# Patient Record
Sex: Female | Born: 1985 | Race: White | Hispanic: No | Marital: Single | Smoking: Current every day smoker
Health system: Southern US, Community
[De-identification: ages and names within clinical notes are randomized; demographics above are authoritative.]

## PROBLEM LIST (undated history)

## (undated) DIAGNOSIS — Z8619 Personal history of other infectious and parasitic diseases: Secondary | ICD-10-CM

## (undated) HISTORY — DX: Personal history of other infectious and parasitic diseases: Z86.19

## (undated) HISTORY — PX: WISDOM TOOTH EXTRACTION: SHX21

---
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
## 2004-06-24 ENCOUNTER — Other Ambulatory Visit: Admission: RE | Admit: 2004-06-24 | Discharge: 2004-06-24 | Payer: Self-pay | Admitting: *Deleted

## 2005-05-05 ENCOUNTER — Other Ambulatory Visit: Admission: RE | Admit: 2005-05-05 | Discharge: 2005-05-05 | Payer: Self-pay | Admitting: Obstetrics and Gynecology

## 2005-12-02 ENCOUNTER — Inpatient Hospital Stay (HOSPITAL_COMMUNITY): Admission: RE | Admit: 2005-12-02 | Discharge: 2005-12-04 | Payer: Self-pay | Admitting: Obstetrics & Gynecology

## 2006-10-25 ENCOUNTER — Other Ambulatory Visit: Admission: RE | Admit: 2006-10-25 | Discharge: 2006-10-25 | Payer: Self-pay | Admitting: *Deleted

## 2008-05-17 ENCOUNTER — Emergency Department (HOSPITAL_COMMUNITY): Admission: EM | Admit: 2008-05-17 | Discharge: 2008-05-17 | Payer: Self-pay | Admitting: Emergency Medicine

## 2008-07-29 ENCOUNTER — Other Ambulatory Visit: Admission: RE | Admit: 2008-07-29 | Discharge: 2008-07-29 | Payer: Self-pay | Admitting: Gynecology

## 2008-07-29 ENCOUNTER — Encounter: Payer: Self-pay | Admitting: Gynecology

## 2008-07-29 ENCOUNTER — Ambulatory Visit: Payer: Self-pay | Admitting: Gynecology

## 2009-08-05 ENCOUNTER — Other Ambulatory Visit: Admission: RE | Admit: 2009-08-05 | Discharge: 2009-08-05 | Payer: Self-pay | Admitting: Gynecology

## 2009-08-05 ENCOUNTER — Ambulatory Visit: Payer: Self-pay | Admitting: Gynecology

## 2010-03-14 IMAGING — CR DG CHEST 2V
2 series · 2 of 2 positions shown · non-contrast
Comparison: None

CLINICAL DATA: Back and right chest pain for 3 days.

CHEST - 2 VIEW

[w chest pa]
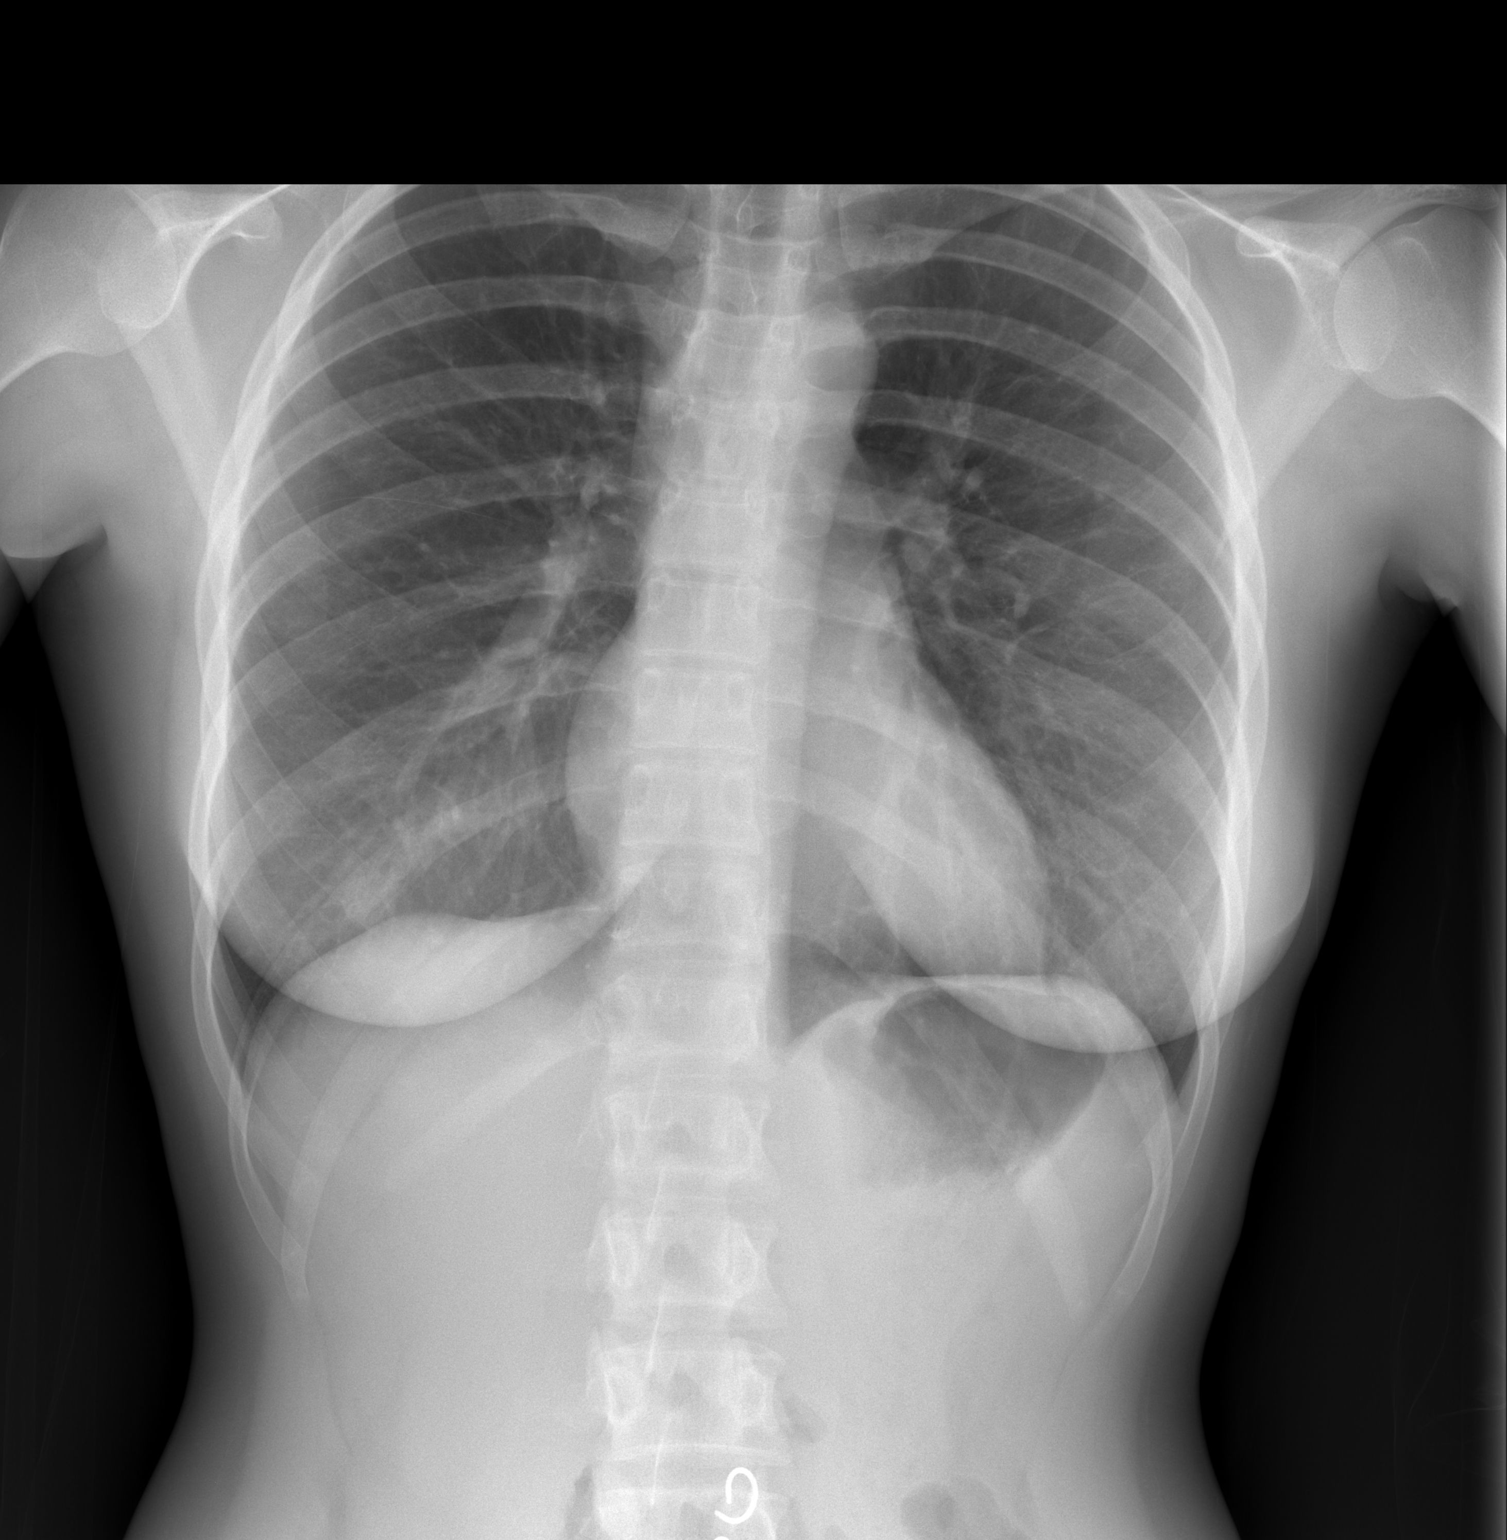

[w chest lat]
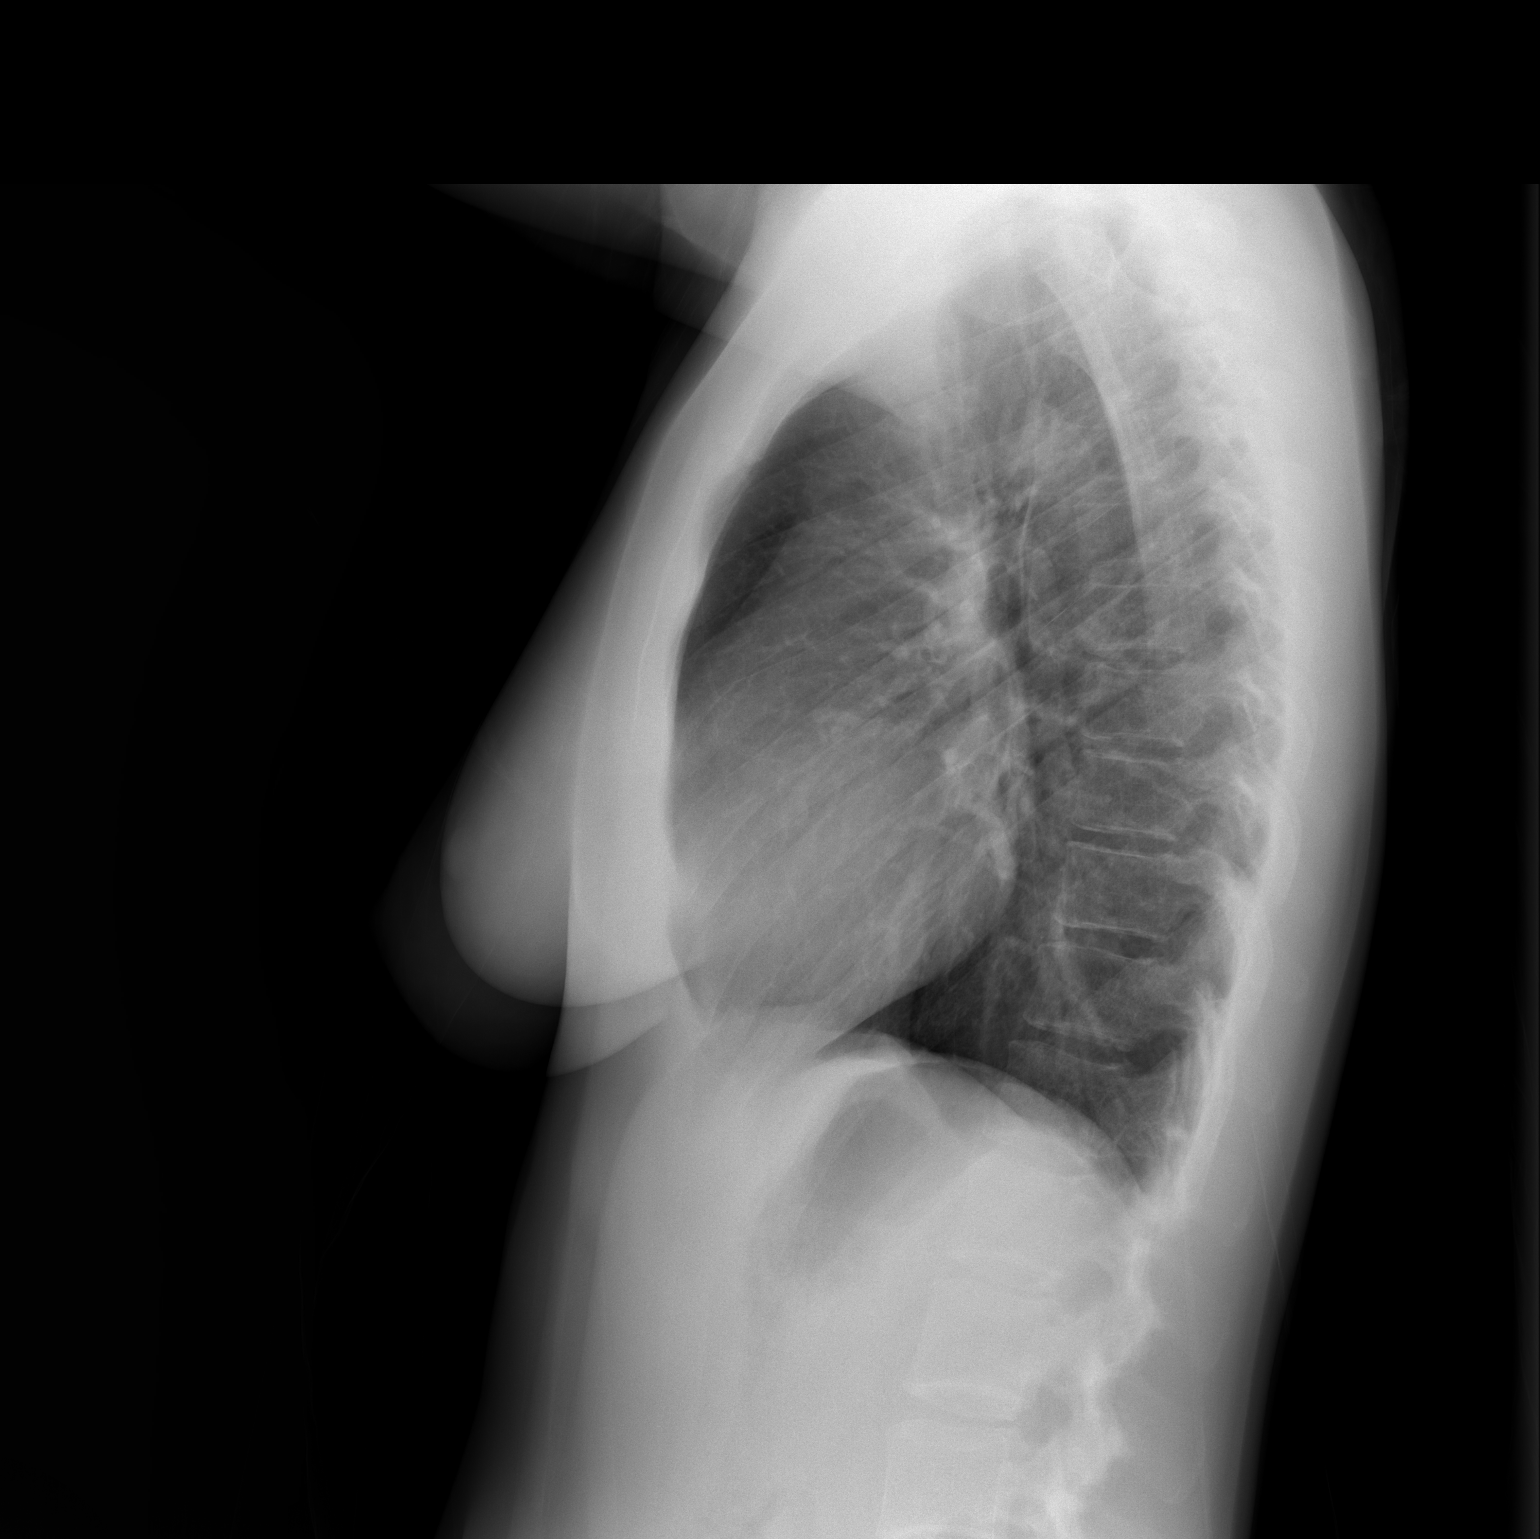

[2 of 2 positions shown; findings below may reference images not displayed]

FINDINGS: The heart size and mediastinal contours are normal.  The
lungs are clear.  There is no pleural effusion or pneumothorax.
Prominent nipple shadows are noted.  No acute osseous findings are
seen.
IMPRESSION: No active cardiopulmonary process.

## 2010-08-10 ENCOUNTER — Encounter (INDEPENDENT_AMBULATORY_CARE_PROVIDER_SITE_OTHER): Payer: Medicaid Other | Admitting: Gynecology

## 2010-08-10 ENCOUNTER — Other Ambulatory Visit: Payer: Self-pay | Admitting: Gynecology

## 2010-08-10 ENCOUNTER — Other Ambulatory Visit (HOSPITAL_COMMUNITY)
Admission: RE | Admit: 2010-08-10 | Discharge: 2010-08-10 | Disposition: A | Discharge status: Home / Self Care | Payer: Medicaid Other | Source: Ambulatory Visit | Attending: Gynecology | Admitting: Gynecology

## 2010-08-10 DIAGNOSIS — Z01419 Encounter for gynecological examination (general) (routine) without abnormal findings: Secondary | ICD-10-CM

## 2010-08-10 DIAGNOSIS — Z113 Encounter for screening for infections with a predominantly sexual mode of transmission: Secondary | ICD-10-CM | POA: Insufficient documentation

## 2010-08-10 DIAGNOSIS — Z124 Encounter for screening for malignant neoplasm of cervix: Secondary | ICD-10-CM | POA: Insufficient documentation

## 2010-08-10 DIAGNOSIS — Z13220 Encounter for screening for lipoid disorders: Secondary | ICD-10-CM

## 2010-09-17 NOTE — Discharge Summary (Signed)
NAME:  Jamie Glenn, Jamie Glenn NO.:  [id number]   MEDICAL RECORD NO.:  [id number]          PATIENT TYPE:  INP   LOCATION:  9105                          FACILITY:  [facility]   PHYSICIAN:  Gerrit Friends. Aldona Bar, M.D.   DATE OF BIRTH:  07/13/1985   DATE OF ADMISSION:  12/02/2005  DATE OF DISCHARGE:  12/04/2005                                 DISCHARGE SUMMARY   DISCHARGE DIAGNOSES:  1.  Term pregnancy, delivered a 6 pound 10 ounce female infant, Apgars 7 and      9.  2.  Blood type O positive.  3.  Induction for oligohydramnios.   PROCEDURES:  1.  Normal spontaneous delivery.  2.  Primary tear and repair.   SUMMARY:  This 25 year old primigravida was seen in the office on August 2  and noted to have a very low amniotic fluid volume.  Her pregnancy was  complicated by her history of bipolar disorder as well as marijuana and  alcohol use during the first trimester of her pregnancy.  She took a  medication called Seroquel about 15 mg a day for her bipolar disorder during  the majority of her pregnancy.  She did undergo amniocentesis at [redacted] weeks  gestation for I presume an abnormal quad screen   Once the patient was admitted, she responded well to Cytotec with cervical  change and subsequently underwent amniotomy with production of a small  amount of clear fluid, and subsequently labored and had a normal spontaneous  delivery of a 6 pound 10 ounce female infant with Apgars of 7 and 9 over a  first-degree tear which was repaired without difficulty.  There was a tight  nuchal cord x1.  Mother's postpartum course was relatively uncomplicated.  Discharge hemoglobin 13 with a white count 16,700 and platelet count of  131,000.   On the morning of August 5, she was ambulating well, tolerating a regular  diet well, having normal bowel and bladder function, was afebrile, and  breast feeding was going well. She was desirous of discharge and accordingly  was discharged to home.   DISCHARGE  MEDICATIONS:  Vitamins, 1 a day.  She was given 2 prescriptions:  Motrin 16 mg to use every 6 hours as needed for cramping or pain and Tylox 1-  2 every 4-6 hours as needed for more severe pain.   She will return to the office for followup in approximately four weeks' time  or as needed.  She was given an instruction brochure and understood all  instructions well at the time of discharge.   The patient was instructed to discuss with pediatricians the use of Seroquel  while breast feeding.   CONDITION ON DISCHARGE:  Improved.      Gerrit Friends. Aldona Bar, M.D.  Electronically Signed     RMW/MEDQ  D:  12/04/2005  T:  12/04/2005  Job:  [id number]

## 2011-09-01 ENCOUNTER — Encounter: Payer: Medicaid Other | Admitting: Gynecology

## 2011-09-02 ENCOUNTER — Other Ambulatory Visit (HOSPITAL_COMMUNITY)
Admission: RE | Admit: 2011-09-02 | Discharge: 2011-09-02 | Disposition: A | Discharge status: Home / Self Care | Payer: Medicaid Other | Source: Ambulatory Visit | Attending: Gynecology | Admitting: Gynecology

## 2011-09-02 ENCOUNTER — Encounter: Payer: Self-pay | Admitting: Gynecology

## 2011-09-02 ENCOUNTER — Ambulatory Visit (INDEPENDENT_AMBULATORY_CARE_PROVIDER_SITE_OTHER): Payer: Medicaid Other | Admitting: Gynecology

## 2011-09-02 VITALS — BP 120/82 | Ht 62.5 in | Wt 131.0 lb

## 2011-09-02 DIAGNOSIS — Z01419 Encounter for gynecological examination (general) (routine) without abnormal findings: Secondary | ICD-10-CM | POA: Insufficient documentation

## 2011-09-02 DIAGNOSIS — N644 Mastodynia: Secondary | ICD-10-CM

## 2011-09-02 DIAGNOSIS — Z113 Encounter for screening for infections with a predominantly sexual mode of transmission: Secondary | ICD-10-CM | POA: Insufficient documentation

## 2011-09-02 LAB — CBC WITH DIFFERENTIAL/PLATELET
Eosinophils Absolute: 0.2 10*3/uL (ref 0.0–0.7)
Hemoglobin: 13.3 g/dL (ref 12.0–15.0)
Lymphocytes Relative: 23 % (ref 12–46)
Lymphs Abs: 2.6 10*3/uL (ref 0.7–4.0)
Neutro Abs: 7.3 10*3/uL (ref 1.7–7.7)
Neutrophils Relative %: 64 % (ref 43–77)
Platelets: 215 10*3/uL (ref 150–400)
RBC: 4.36 MIL/uL (ref 3.87–5.11)
WBC: 11.3 10*3/uL — ABNORMAL HIGH (ref 4.0–10.5)

## 2011-09-02 LAB — PREGNANCY, URINE: Preg Test, Ur: NEGATIVE

## 2011-09-02 MED ORDER — NORGESTREL-ETHINYL ESTRADIOL 0.3-30 MG-MCG PO TABS: 1.0000 | ORAL_TABLET | Freq: Every day | ORAL | Status: DC

## 2011-09-02 NOTE — Progress Notes (Addendum)
Jamie Glenn 1985-06-10 [id number]   History:    26 y.o.  for annual exam complaining of breast tenderness since the completion of her menses. Patient smokes half a pack a cigarettes daily and drinks more than 6 polyp averages a day. She is on Lo/Ovral oral contraceptive pills and is having normal menstrual cycles. Review of patient's records indicated the following:  In 2003 patient had undergone a colposcopic evaluation as a result of low-grade squamous intraepithelial lesion (CIN I) negative colposcopic findings but an ECC was obtained which had demonstrated nonspecific chronic endocervicitis associated with endocervical glandular atypia and focal squamous metaplasia. Description by the pathologist indicated that this was associated with reactive inflammatory change rather than a dysplastic change. Patient subsequently has been followed with Pap smears on a regular basis and has been normal since then. Patient has a history of trichomoniasis in 2008.  Past medical history,surgical history, family history and social history were all reviewed and documented in the EPIC chart.  Gynecologic History Patient's last menstrual period was 08/27/2011. Contraception: OCP (estrogen/progesterone) Last Pap: 2012. Results were: normal Last mammogram: Not indicated. Results were: Not indicated  Obstetric History OB History    Grav Para Term Preterm Abortions TAB SAB Ect Mult Living   1 1 1       1      # Outc Date GA Lbr Len/2nd Wgt Sex Del Anes PTL Lv   1 TRM     M SVD  No Yes       ROS:  Was performed and pertinent positives and negatives are included in the history.  Exam: chaperone present  BP 120/82  Ht 5' 2.5" (1.588 m)  Wt 131 lb (59.421 kg)  BMI 23.58 kg/m2  LMP 08/27/2011  Body mass index is 23.58 kg/(m^2).  General appearance : Well developed well nourished female. No acute distress HEENT: Neck supple, trachea midline, no carotid bruits, no thyroidmegaly Lungs: Clear to  auscultation, no rhonchi or wheezes, or rib retractions  Heart: Regular rate and rhythm, no murmurs or gallops Breast:Examined in sitting and supine position were symmetrical in appearance, no palpable masses or tenderness,  no skin retraction, no nipple inversion, no nipple discharge, no skin discoloration, no axillary or supraclavicular lymphadenopathy Abdomen: no palpable masses or tenderness, no rebound or guarding Extremities: no edema or skin discoloration or tenderness  Pelvic:  Bartholin, Urethra, Skene Glands: Within normal limits             Vagina: No gross lesions or discharge  Cervix: No gross lesions or discharge  Uterus  anteverted, normal size, shape and consistency, non-tender and mobile  Adnexa  Without masses or tenderness  Anus and perineum  normal   Rectovaginal  normal sphincter tone without palpated masses or tenderness             Hemoccult not done     Assessment/Plan:  26 y.o. female for annual exam with mastodynia. We discussed importance of self breast examination. We discussed in detail the detrimental effects of smoking as well as being on the oral contraceptive pill which could lead to DVT and pulmonary embolism. She was offered treatment for smoking and says that she would rather go to the anti- smoking support group with her partner instead. The following labs were drawn today: CBC, urinalysis, GC and Chlamydia culture, and Pap smear. We'll see her back in one year or when necessary. Urine pregnancy today was negative.   Ok Edwards MD, 11:25 AM 09/02/2011

## 2011-09-02 NOTE — Patient Instructions (Addendum)
Breast Tenderness Breast tenderness is a common complaint made by women of all ages. It is also called mastalgia or mastodynia, which means breast pain. The condition can range from mild discomfort to severe pain. It has a variety of causes. Your caregiver will find out the likely cause of your breast tenderness by examining your breasts, asking you about symptoms and perhaps ordering some tests. Breast tenderness usually does not mean you have breast cancer. CAUSES  Breast tenderness has many possible causes. They include:  Premenstrual changes. A week to 10 days before your period, your breasts might ache or feel tender.   Other hormonal causes. These include:   When sexual and physical traits mature (puberty).   Pregnancy.   The time right before and the year after menopause (perimenopause).   The day when it has been 12 months since your last period (menopause).   Large breasts.   Infection (also called mastitis).   Birth control pills.   Breastfeeding. Tenderness can occur if the breasts are overfull with milk or if a milk duct is blocked.   Injury.   Fibrocystic breast changes. This is not cancer (benign). It causes painful breasts that feel lumpy.   Fluid-filled sacs (cysts). Often cysts can be drained in your healthcare provider's office.   Fibroadenoma. This is a tumor that is not cancerous.   Medication side effects. Blood pressure drugs and diuretics (which increase urine flow) sometimes cause breast tenderness.   Previous breast surgery, such as a breast reduction.   Breast cancer. Cancer is rarely the reason breasts are tender. In most women, tenderness is caused by something else.  DIAGNOSIS  Several methods can be used to find out why your breasts are tender. They include:  Visual inspection of the breasts.   Examination by hand.   Tests, such as:   Mammogram.   Ultrasound.   Biopsy.   Lab test of any fluid coming from the nipple.   Blood tests.     MRI.  TREATMENT  Treatment is directed to the cause of the breast tenderness from doing nothing for minor discomfort, wearing a good support bra but also may include:  Taking over-the-counter medicines for pain or discomfort as directed by your caregiver.   Prescription medicine for breast tenderness related to:   Premenstrual.   Fibrocystic.   Puberty.   Pregnancy.   Menopause.   Previous breast surgery.   Large breasts.   Antibiotics for infection.   Birth control pills for fibrocystic and premenstrual changes.   More frequent feedings or pumping of the breasts and warm compresses for breast engorgement when nursing.   Cold and warm compresses and a good support bra for most breast injuries.   Breast cysts are sometimes drained with a needle (aspiration) or removed with minor surgery.   Fibroadenomas are usually removed with minor surgery.   Changing or stopping the medicine when it is responsible for causing the breast tenderness.   When breast cancer is present with or without causing pain, it is usually treated with major surgery (with or without radiation) and chemotherapy.  HOME CARE INSTRUCTIONS  Breast tenderness often can be handled at home. You can try:  Getting fitted for a new bra that provides more support, especially during exercise.   Wearing a more supportive or sports bra while sleeping when your breasts are very tender.   If you have a breast injury, using an ice pack for 15 to 20 minutes. Wrap the pack in a   towel. Do not put the ice pack directly on your breast.   If your breasts are too full of milk as a result of breastfeeding, try:   Expressing milk either by hand or with a breast pump.   Applying a warm compress for relief.   Taking over-the-counter pain relievers, if this is OK with your caregiver.   Taking medicine that your caregiver prescribes. These might include antibiotics or birth control pills.  Over the long term, your  breast tenderness might be eased if you:  Cut down on caffeine.   Reduce the amount of fat in your diet.  Also, learn how to do breast examinations at home. This will help you tell when you have an unusual growth or lump that could cause tenderness. And keep a log of the days and times when your breasts are most tender. This will help you and your caregiver find the right solution. SEEK MEDICAL CARE IF:   Any part of your breast is hard, red and hot to the touch. This could be a sign of infection.   Fluid is coming out of your nipples (and you are not breastfeeding). Especially watch for blood or pus.   You have a fever as well as breast tenderness.   You have a new or painful lump in your breast that remains after your period ends.   You have tried to take care of the pain at home, but it has not gone away.   Your breast pain is getting worse. Or, the pain is making it hard to do the things you usually do during your day.  Document Released: 03/31/2008 Document Revised: 04/07/2011 Document Reviewed: 03/31/2008 [facility] Patient Information 2012 [place], [place].  Smoking Cessation This document explains the best ways for you to quit smoking and new treatments to help. It lists new medicines that can double or triple your chances of quitting and quitting for good. It also considers ways to avoid relapses and concerns you may have about quitting, including weight gain. NICOTINE: A POWERFUL ADDICTION If you have tried to quit smoking, you know how hard it can be. It is hard because nicotine is a very addictive drug. For some people, it can be as addictive as heroin or cocaine. Usually, people make 2 or 3 tries, or more, before finally being able to quit. Each time you try to quit, you can learn about what helps and what hurts. Quitting takes hard work and a lot of effort, but you can quit smoking. QUITTING SMOKING IS ONE OF THE MOST IMPORTANT THINGS YOU WILL EVER DO.  You will live longer,  feel better, and live better.   The impact on your body of quitting smoking is felt almost immediately:   Within 20 minutes, blood pressure decreases. Pulse returns to its normal level.   After 8 hours, carbon monoxide levels in the blood return to normal. Oxygen level increases.   After 24 hours, chance of heart attack starts to decrease. Breath, hair, and body stop smelling like smoke.   After 48 hours, damaged nerve endings begin to recover. Sense of taste and smell improve.   After 72 hours, the body is virtually free of nicotine. Bronchial tubes relax and breathing becomes easier.   After 2 to 12 weeks, lungs can hold more air. Exercise becomes easier and circulation improves.   Quitting will reduce your risk of having a heart attack, stroke, cancer, or lung disease:   After 1 year, the risk of coronary heart  disease is cut in half.   After 5 years, the risk of stroke falls to the same as a nonsmoker.   After 10 years, the risk of lung cancer is cut in half and the risk of other cancers decreases significantly.   After 15 years, the risk of coronary heart disease drops, usually to the level of a nonsmoker.   If you are pregnant, quitting smoking will improve your chances of having a healthy baby.   The people you live with, especially your children, will be healthier.   You will have extra money to spend on things other than cigarettes.  FIVE KEYS TO QUITTING Studies have shown that these 5 steps will help you quit smoking and quit for good. You have the best chances of quitting if you use them together: 1. Get ready.  2. Get support and encouragement.  3. Learn new skills and behaviors.  4. Get medicine to reduce your nicotine addiction and use it correctly.  5. Be prepared for relapse or difficult situations. Be determined to continue trying to quit, even if you do not succeed at first.  1. GET READY  Set a quit date.   Change your environment.   Get rid of ALL  cigarettes, ashtrays, matches, and lighters in your home, car, and place of work.   Do not let people smoke in your home.   Review your past attempts to quit. Think about what worked and what did not.   Once you quit, do not smoke. NOT EVEN A PUFF!  2. GET SUPPORT AND ENCOURAGEMENT Studies have shown that you have a better chance of being successful if you have help. You can get support in many ways.  Tell your family, friends, and coworkers that you are going to quit and need their support. Ask them not to smoke around you.   Talk to your caregivers (doctor, dentist, nurse, pharmacist, psychologist, and/or smoking counselor).   Get individual, group, or telephone counseling and support. The more counseling you have, the better your chances are of quitting. Programs are available at [employer] and health centers. Call your local health department for information about programs in your area.   Spiritual beliefs and practices may help some smokers quit.   Quit meters are Photographer that keep track of quit statistics, such as amount of "quit-time," cigarettes not smoked, and money saved.   Many smokers find one or more of the many self-help books available useful in helping them quit and stay off tobacco.  3. LEARN NEW SKILLS AND BEHAVIORS  Try to distract yourself from urges to smoke. Talk to someone, go for a walk, or occupy your time with a task.   When you first try to quit, change your routine. Take a different route to work. Drink tea instead of coffee. Eat breakfast in a different place.   Do something to reduce your stress. Take a hot bath, exercise, or read a book.   Plan something enjoyable to do every day. Reward yourself for not smoking.   Explore interactive web-based programs that specialize in helping you quit.  4. GET MEDICINE AND USE IT CORRECTLY Medicines can help you stop smoking and decrease the urge to smoke. Combining  medicine with the above behavioral methods and support can quadruple your chances of successfully quitting smoking. The U.S. Food and Drug Administration (FDA) has approved 7 medicines to help you quit smoking. These medicines fall into 3 categories.  Nicotine replacement  therapy (delivers nicotine to your body without the negative effects and risks of smoking):   Nicotine gum: Available over-the-counter.   Nicotine lozenges: Available over-the-counter.   Nicotine inhaler: Available by prescription.   Nicotine nasal spray: Available by prescription.   Nicotine skin patches (transdermal): Available by prescription and over-the-counter.   Antidepressant medicine (helps people abstain from smoking, but how this works is unknown):   Bupropion sustained-release (SR) tablets: Available by prescription.   Nicotinic receptor partial agonist (simulates the effect of nicotine in your brain):   Varenicline tartrate tablets: Available by prescription.   Ask your caregiver for advice about which medicines to use and how to use them. Carefully read the information on the package.   Everyone who is trying to quit may benefit from using a medicine. If you are pregnant or trying to become pregnant, nursing an infant, you are under age 61, or you smoke fewer than 10 cigarettes per day, talk to your caregiver before taking any nicotine replacement medicines.   You should stop using a nicotine replacement product and call your caregiver if you experience nausea, dizziness, weakness, vomiting, fast or irregular heartbeat, mouth problems with the lozenge or gum, or redness or swelling of the skin around the patch that does not go away.   Do not use any other product containing nicotine while using a nicotine replacement product.   Talk to your caregiver before using these products if you have diabetes, heart disease, asthma, stomach ulcers, you had a recent heart attack, you have high blood pressure that is  not controlled with medicine, a history of irregular heartbeat, or you have been prescribed medicine to help you quit smoking.  5. BE PREPARED FOR RELAPSE OR DIFFICULT SITUATIONS  Most relapses occur within the first 3 months after quitting. Do not be discouraged if you start smoking again. Remember, most people try several times before they finally quit.   You may have symptoms of withdrawal because your body is used to nicotine. You may crave cigarettes, be irritable, feel very hungry, cough often, get headaches, or have difficulty concentrating.   The withdrawal symptoms are only temporary. They are strongest when you first quit, but they will go away within 10 to 14 days.  Here are some difficult situations to watch for:  Alcohol. Avoid drinking alcohol. Drinking lowers your chances of successfully quitting.   Caffeine. Try to reduce the amount of caffeine you consume. It also lowers your chances of successfully quitting.   Other smokers. Being around smoking can make you want to smoke. Avoid smokers.   Weight gain. Many smokers will gain weight when they quit, usually less than 10 pounds. Eat a healthy diet and stay active. Do not let weight gain distract you from your main goal, quitting smoking. Some medicines that help you quit smoking may also help delay weight gain. You can always lose the weight gained after you quit.   Bad mood or depression. There are a lot of ways to improve your mood other than smoking.  If you are having problems with any of these situations, talk to your caregiver. SPECIAL SITUATIONS AND CONDITIONS Studies suggest that everyone can quit smoking. Your situation or condition can give you a special reason to quit.  Pregnant women/new mothers: By quitting, you protect your baby's health and your own.   Hospitalized patients: By quitting, you reduce health problems and help healing.   Heart attack patients: By quitting, you reduce your risk of a second heart  attack.   Lung, head, and neck cancer patients: By quitting, you reduce your chance of a second cancer.   Parents of children and adolescents: By quitting, you protect your children from illnesses caused by secondhand smoke.  QUESTIONS TO THINK ABOUT Think about the following questions before you try to stop smoking. You may want to talk about your answers with your caregiver.  Why do you want to quit?   If you tried to quit in the past, what helped and what did not?   What will be the most difficult situations for you after you quit? How will you plan to handle them?   Who can help you through the tough times? Your family? Friends? Caregiver?   What pleasures do you get from smoking? What ways can you still get pleasure if you quit?  Here are some questions to ask your caregiver:  How can you help me to be successful at quitting?   What medicine do you think would be best for me and how should I take it?   What should I do if I need more help?   What is smoking withdrawal like? How can I get information on withdrawal?  Quitting takes hard work and a lot of effort, but you can quit smoking. FOR MORE INFORMATION  Smokefree.gov (http://www.davis-sullivan.com/) provides free, accurate, evidence-based information and professional assistance to help support the immediate and long-term needs of people trying to quit smoking. Document Released: 04/12/2001 Document Revised: 04/07/2011 Document Reviewed: 02/02/2009 [facility] Patient Information 2012 [place], [place].   Vitamin E 600 units daily helps with chronic breast tenderness  Breast Self-Exam A self breast exam may help you find changes or problems while they are still small. Do a breast self-exam:  Every month.   One week after your period (menstrual period).   On the first day of each month if you do not have periods anymore.  Look for any:  Change in breast color, size, or shape.   Dimples in your breast.   Changes in your  nipples or skin.   Dry skin on your breasts or nipples.   Watery or bloody discharge from your nipples.   Feel for:  Lumps.   Thick, hard places.   Any other changes.  HOME CARE There are 3 ways to do the breast self-exam: In front of a mirror.  Lift your arms over your head and turn side to side.   Put your hands on your hips and lean down, then turn from side to side.   Bend forward and turn from side to side.  In the shower.  With soapy hands, check both breasts. Then check above and below your collarbone and your armpits.   Feel above and below your collarbone down to under your breast, and from the center of your chest to the outer edge of the armpit. Check for any lumps or hard spots.   Using the tips of your middle three fingers check your whole breast by pressing your hand over your breast in a circle or in an up and down motion.  Lying down.  Lie flat on your bed.   Put a small pillow under the breast you are going to check. On that same side, put your hand behind your head.   With your other hand, use the 3 middle fingers to feel the breast.   Move your fingers in a circle around the breast. Press firmly over all parts of the breast to feel for any  lumps.  GET HELP RIGHT AWAY IF: You find any changes in your breasts so they can be checked. Document Released: 10/05/2007 Document Revised: 04/07/2011 Document Reviewed: 08/06/2008 [facility] Patient Information 2012 [place], [place].

## 2011-09-03 LAB — URINALYSIS W MICROSCOPIC + REFLEX CULTURE
Casts: NONE SEEN
Hgb urine dipstick: NEGATIVE
Leukocytes, UA: NEGATIVE
Nitrite: NEGATIVE
Specific Gravity, Urine: 1.018 (ref 1.005–1.030)
Squamous Epithelial / LPF: NONE SEEN
pH: 6 (ref 5.0–8.0)

## 2011-09-05 ENCOUNTER — Other Ambulatory Visit: Payer: Self-pay | Admitting: *Deleted

## 2011-09-05 DIAGNOSIS — D72829 Elevated white blood cell count, unspecified: Secondary | ICD-10-CM

## 2011-09-08 ENCOUNTER — Other Ambulatory Visit: Payer: Medicaid Other

## 2011-09-08 DIAGNOSIS — D72829 Elevated white blood cell count, unspecified: Secondary | ICD-10-CM

## 2011-09-08 LAB — CBC WITH DIFFERENTIAL/PLATELET
Eosinophils Relative: 1 % (ref 0–5)
HCT: 42.5 % (ref 36.0–46.0)
Lymphocytes Relative: 32 % (ref 12–46)
Lymphs Abs: 2.8 10*3/uL (ref 0.7–4.0)
MCV: 95.7 fL (ref 78.0–100.0)
Monocytes Absolute: 1 10*3/uL (ref 0.1–1.0)
RBC: 4.44 MIL/uL (ref 3.87–5.11)
RDW: 13.6 % (ref 11.5–15.5)
WBC: 8.8 10*3/uL (ref 4.0–10.5)

## 2011-09-20 ENCOUNTER — Telehealth: Payer: Self-pay | Admitting: *Deleted

## 2011-09-20 MED ORDER — NORETHINDRONE 0.35 MG PO TABS: 1.0000 | ORAL_TABLET | Freq: Every day | ORAL | Status: DC

## 2011-09-20 NOTE — Telephone Encounter (Signed)
Left the below on pt voicemail, rx sent 

## 2011-09-20 NOTE — Telephone Encounter (Signed)
Pt called stating she was taking Micronor (progesterone only)  back in April 2012 and now you switched her to lo/ ovral 28 day. Pt said she still smokes the same amount of cigarettes per day (half pack) . She would like to know why she is taking the lo/ovral 28 rather than starting back on Micronor? Please advise

## 2011-09-20 NOTE — Telephone Encounter (Signed)
It appears that she had been on low overall from what I saw in her chart but because of her smoking the Microgestin would would be a better pill for her. Please call her in the Microgestin for her to continue to take instead but finish of her current pack. We can call her in one month supply with 11 refills

## 2012-09-04 ENCOUNTER — Encounter: Payer: Self-pay | Admitting: Gynecology

## 2012-10-25 ENCOUNTER — Other Ambulatory Visit (HOSPITAL_COMMUNITY)
Admission: RE | Admit: 2012-10-25 | Discharge: 2012-10-25 | Disposition: A | Discharge status: Home / Self Care | Payer: Medicaid Other | Source: Ambulatory Visit | Attending: Gynecology | Admitting: Gynecology

## 2012-10-25 ENCOUNTER — Encounter: Payer: Self-pay | Admitting: Gynecology

## 2012-10-25 ENCOUNTER — Ambulatory Visit (INDEPENDENT_AMBULATORY_CARE_PROVIDER_SITE_OTHER): Payer: Medicaid Other | Admitting: Gynecology

## 2012-10-25 VITALS — BP 124/78 | Ht 62.25 in | Wt 133.0 lb

## 2012-10-25 DIAGNOSIS — Z01419 Encounter for gynecological examination (general) (routine) without abnormal findings: Secondary | ICD-10-CM

## 2012-10-25 DIAGNOSIS — Z124 Encounter for screening for malignant neoplasm of cervix: Secondary | ICD-10-CM

## 2012-10-25 DIAGNOSIS — F17200 Nicotine dependence, unspecified, uncomplicated: Secondary | ICD-10-CM | POA: Insufficient documentation

## 2012-10-25 DIAGNOSIS — Z113 Encounter for screening for infections with a predominantly sexual mode of transmission: Secondary | ICD-10-CM

## 2012-10-25 DIAGNOSIS — Z3009 Encounter for other general counseling and advice on contraception: Secondary | ICD-10-CM

## 2012-10-25 LAB — WET PREP FOR TRICH, YEAST, CLUE
Clue Cells Wet Prep HPF POC: NONE SEEN
WBC, Wet Prep HPF POC: NONE SEEN
Yeast Wet Prep HPF POC: NONE SEEN

## 2012-10-25 LAB — CBC WITH DIFFERENTIAL/PLATELET
Basophils Relative: 1 % (ref 0–1)
Eosinophils Absolute: 0.1 10*3/uL (ref 0.0–0.7)
HCT: 40.8 % (ref 36.0–46.0)
Hemoglobin: 14 g/dL (ref 12.0–15.0)
MCH: 30.3 pg (ref 26.0–34.0)
MCHC: 34.3 g/dL (ref 30.0–36.0)
Monocytes Absolute: 1.2 10*3/uL — ABNORMAL HIGH (ref 0.1–1.0)
Monocytes Relative: 15 % — ABNORMAL HIGH (ref 3–12)

## 2012-10-25 MED ORDER — NORETHINDRONE 0.35 MG PO TABS: 1.0000 | ORAL_TABLET | Freq: Every day | ORAL | Status: DC

## 2012-10-25 NOTE — Patient Instructions (Addendum)
Smoking Cessation Quitting smoking is important to your health and has many advantages. However, it is not always easy to quit since nicotine is a very addictive drug. Often times, people try 3 times or more before being able to quit. This document explains the best ways for you to prepare to quit smoking. Quitting takes hard work and a lot of effort, but you can do it. ADVANTAGES OF QUITTING SMOKING  You will live longer, feel better, and live better.  Your body will feel the impact of quitting smoking almost immediately.  Within 20 minutes, blood pressure decreases. Your pulse returns to its normal level.  After 8 hours, carbon monoxide levels in the blood return to normal. Your oxygen level increases.  After 24 hours, the chance of having a heart attack starts to decrease. Your breath, hair, and body stop smelling like smoke.  After 48 hours, damaged nerve endings begin to recover. Your sense of taste and smell improve.  After 72 hours, the body is virtually free of nicotine. Your bronchial tubes relax and breathing becomes easier.  After 2 to 12 weeks, lungs can hold more air. Exercise becomes easier and circulation improves.  The risk of having a heart attack, stroke, cancer, or lung disease is greatly reduced.  After 1 year, the risk of coronary heart disease is cut in half.  After 5 years, the risk of stroke falls to the same as a nonsmoker.  After 10 years, the risk of lung cancer is cut in half and the risk of other cancers decreases significantly.  After 15 years, the risk of coronary heart disease drops, usually to the level of a nonsmoker.  If you are pregnant, quitting smoking will improve your chances of having a healthy baby.  The people you live with, especially any children, will be healthier.  You will have extra money to spend on things other than cigarettes. QUESTIONS TO THINK ABOUT BEFORE ATTEMPTING TO QUIT You may want to talk about your answers with your  caregiver.  Why do you want to quit?  If you tried to quit in the past, what helped and what did not?  What will be the most difficult situations for you after you quit? How will you plan to handle them?  Who can help you through the tough times? Your family? Friends? A caregiver?  What pleasures do you get from smoking? What ways can you still get pleasure if you quit? Here are some questions to ask your caregiver:  How can you help me to be successful at quitting?  What medicine do you think would be best for me and how should I take it?  What should I do if I need more help?  What is smoking withdrawal like? How can I get information on withdrawal? GET READY  Set a quit date.  Change your environment by getting rid of all cigarettes, ashtrays, matches, and lighters in your home, car, or work. Do not let people smoke in your home.  Review your past attempts to quit. Think about what worked and what did not. GET SUPPORT AND ENCOURAGEMENT You have a better chance of being successful if you have help. You can get support in many ways.  Tell your family, friends, and co-workers that you are going to quit and need their support. Ask them not to smoke around you.  Get individual, group, or telephone counseling and support. Programs are available at local hospitals and health centers. Call your local health department for   information about programs in your area.  Spiritual beliefs and practices may help some smokers quit.  Download a "quit meter" on your computer to keep track of quit statistics, such as how long you have gone without smoking, cigarettes not smoked, and money saved.  Get a self-help book about quitting smoking and staying off of tobacco. LEARN NEW SKILLS AND BEHAVIORS  Distract yourself from urges to smoke. Talk to someone, go for a walk, or occupy your time with a task.  Change your normal routine. Take a different route to work. Drink tea instead of coffee.  Eat breakfast in a different place.  Reduce your stress. Take a hot bath, exercise, or read a book.  Plan something enjoyable to do every day. Reward yourself for not smoking.  Explore interactive web-based programs that specialize in helping you quit. GET MEDICINE AND USE IT CORRECTLY Medicines can help you stop smoking and decrease the urge to smoke. Combining medicine with the above behavioral methods and support can greatly increase your chances of successfully quitting smoking.  Nicotine replacement therapy helps deliver nicotine to your body without the negative effects and risks of smoking. Nicotine replacement therapy includes nicotine gum, lozenges, inhalers, nasal sprays, and skin patches. Some may be available over-the-counter and others require a prescription.  Antidepressant medicine helps people abstain from smoking, but how this works is unknown. This medicine is available by prescription.  Nicotinic receptor partial agonist medicine simulates the effect of nicotine in your brain. This medicine is available by prescription. Ask your caregiver for advice about which medicines to use and how to use them based on your health history. Your caregiver will tell you what side effects to look out for if you choose to be on a medicine or therapy. Carefully read the information on the package. Do not use any other product containing nicotine while using a nicotine replacement product.  RELAPSE OR DIFFICULT SITUATIONS Most relapses occur within the first 3 months after quitting. Do not be discouraged if you start smoking again. Remember, most people try several times before finally quitting. You may have symptoms of withdrawal because your body is used to nicotine. You may crave cigarettes, be irritable, feel very hungry, cough often, get headaches, or have difficulty concentrating. The withdrawal symptoms are only temporary. They are strongest when you first quit, but they will go away within  10 14 days. To reduce the chances of relapse, try to:  Avoid drinking alcohol. Drinking lowers your chances of successfully quitting.  Reduce the amount of caffeine you consume. Once you quit smoking, the amount of caffeine in your body increases and can give you symptoms, such as a rapid heartbeat, sweating, and anxiety.  Avoid smokers because they can make you want to smoke.  Do not let weight gain distract you. Many smokers will gain weight when they quit, usually less than 10 pounds. Eat a healthy diet and stay active. You can always lose the weight gained after you quit.  Find ways to improve your mood other than smoking. FOR MORE INFORMATION  www.smokefree.gov  Document Released: 04/12/2001 Document Revised: 10/18/2011 Document Reviewed: 07/28/2011 [facility] Patient Information 2014 [place], [place].  Varenicline oral tablets What is this medicine? VARENICLINE (var EN i kleen) is used to help people quit smoking. It can reduce the symptoms caused by stopping smoking. It is used with a patient support program recommended by your physician. This medicine may be used for other purposes; ask your health care provider or pharmacist if you  have questions. What should I tell my health care provider before I take this medicine? They need to know if you have any of these conditions: -bipolar disorder, depression, schizophrenia or other mental illness -heart disease -kidney disease -peripheral vascular disease -stroke -suicidal thoughts, plans, or attempt; a previous suicide attempt by you or a family member -an unusual or allergic reaction to varenicline, other medicines, foods, dyes, or preservatives -pregnant or trying to get pregnant -breast-feeding How should I use this medicine? You should set a date to stop smoking and tell your doctor. Start this medicine one week before the quit date. You can also start taking this medicine before you choose a quit date, and then pick a quit date  that is between 8 and 35 days of treatment with this medicine. Stick to your plan; ask about support groups or other ways to help you remain a 'quitter'. Take this medicine by mouth after eating. Take with a full glass of water. Follow the directions on the prescription label. Take your doses at regular intervals. Do not take your medicine more often than directed. A special MedGuide will be given to you by the pharmacist with each prescription and refill. Be sure to read this information carefully each time. Talk to your pediatrician regarding the use of this medicine in children. This medicine is not approved for use in children. Overdosage: If you think you have taken too much of this medicine contact a poison control center or emergency room at once. NOTE: This medicine is only for you. Do not share this medicine with others. What if I miss a dose? If you miss a dose, take it as soon as you can. If it is almost time for your next dose, take only that dose. Do not take double or extra doses. What may interact with this medicine? -insulin -other stop smoking aids -theophylline -warfarin This list may not describe all possible interactions. Give your health care provider a list of all the medicines, herbs, non-prescription drugs, or dietary supplements you use. Also tell them if you smoke, drink alcohol, or use illegal drugs. Some items may interact with your medicine. What should I watch for while using this medicine? Visit your doctor or health care professional for regular check ups. Ask for ongoing advice and encouragement from your doctor or healthcare professional, friends, and family to help you quit. If you smoke while on this medication, quit again Your mouth may get dry. Chewing sugarless gum or sucking hard candy, and drinking plenty of water may help. Contact your doctor if the problem does not go away or is severe. You may get drowsy or dizzy. Do not drive, use machinery, or do  anything that needs mental alertness until you know how this medicine affects you. Do not stand or sit up quickly, especially if you are an older patient. This reduces the risk of dizzy or fainting spells. The use of this medicine may increase the chance of suicidal thoughts or actions. Pay special attention to how you are responding while on this medicine. Any worsening of mood, or thoughts of suicide or dying should be reported to your health care professional right away. What side effects may I notice from receiving this medicine? Side effects that you should report to your doctor or health care professional as soon as possible: -allergic reactions like skin rash, itching or hives, swelling of the face, lips, tongue, or throat -breathing problems -changes in vision -chest pain or chest tightness -confusion, trouble speaking  or understanding -fast, irregular heartbeat -feeling faint or lightheaded, falls -fever -pain in legs when walking -problems with balance, talking, walking -ringing in ears -sudden numbness or weakness of the face, arm or leg -suicidal thoughts or other mood changes -trouble passing urine or change in the amount of urine -unusual bleeding or bruising -unusually weak or tired Side effects that usually do not require medical attention (report to your doctor or health care professional if they continue or are bothersome): -constipation -headache -nausea, vomiting -strange dreams -stomach gas -trouble sleeping This list may not describe all possible side effects. Call your doctor for medical advice about side effects. You may report side effects to FDA at 1-800-FDA-1088. Where should I keep my medicine? Keep out of the reach of children. Store at room temperature between 15 and 30 degrees C (59 and 86 degrees F). Throw away any unused medicine after the expiration date. NOTE: This sheet is a summary. It may not cover all possible information. If you have questions  about this medicine, talk to your doctor, pharmacist, or health care provider.  2013, [employer]vier/[employer] Standard. (11/23/2009 3:12:38 PM)

## 2012-10-25 NOTE — Progress Notes (Signed)
LESHIA KOPE Apr 07, 1986 [id number]   History:    27 y.o.  for annual gyn exam who wanted to have an STD screen stating that her live in partner had been unfaithful to her. Review of her record and indicated in 2008 she was treated for trichomoniasis from the same partner. She has regular cycles but they fluctuated times. She is a smoker and had been on progesterone only oral contraceptive pill that has not been on it for several months because she ran out of her prescription. Today's her first day of her menstrual cycle. Patient with no prior history of abnormal Pap smears. Patient states her Tdap vaccine is up to date  Past medical history,surgical history, family history and social history were all reviewed and documented in the EPIC chart.  Gynecologic History Patient's last menstrual period was 10/25/2012. Contraception: condoms Last Pap: 2013. Results were: normal Last mammogram: that indicated. Results were: none indicated  Obstetric History OB History   Grav Para Term Preterm Abortions TAB SAB Ect Mult Living   1 1 1       1      # Outc Date GA Lbr Len/2nd Wgt Sex Del Anes PTL Lv   1 TRM     M SVD  No Yes       ROS: A ROS was performed and pertinent positives and negatives are included in the history.  GENERAL: No fevers or chills. HEENT: No change in vision, no earache, sore throat or sinus congestion. NECK: No pain or stiffness. CARDIOVASCULAR: No chest pain or pressure. No palpitations. PULMONARY: No shortness of breath, cough or wheeze. GASTROINTESTINAL: No abdominal pain, nausea, vomiting or diarrhea, melena or bright red blood per rectum. GENITOURINARY: No urinary frequency, urgency, hesitancy or dysuria. MUSCULOSKELETAL: No joint or muscle pain, no back pain, no recent trauma. DERMATOLOGIC: No rash, no itching, no lesions. ENDOCRINE: No polyuria, polydipsia, no heat or cold intolerance. No recent change in weight. HEMATOLOGICAL: No anemia or easy bruising or bleeding. NEUROLOGIC:  No headache, seizures, numbness, tingling or weakness. PSYCHIATRIC: No depression, no loss of interest in normal activity or change in sleep pattern.     Exam: chaperone present  BP 124/78  Ht 5' 2.25" (1.581 m)  Wt 133 lb (60.328 kg)  BMI 24.14 kg/m2  LMP 10/25/2012  Body mass index is 24.14 kg/(m^2).  General appearance : Well developed well nourished female. No acute distress HEENT: Neck supple, trachea midline, no carotid bruits, no thyroidmegaly Lungs: Clear to auscultation, no rhonchi or wheezes, or rib retractions  Heart: Regular rate and rhythm, no murmurs or gallops Breast:Examined in sitting and supine position were symmetrical in appearance, no palpable masses or tenderness,  no skin retraction, no nipple inversion, no nipple discharge, no skin discoloration, no axillary or supraclavicular lymphadenopathy Abdomen: no palpable masses or tenderness, no rebound or guarding Extremities: no edema or skin discoloration or tenderness  Pelvic:  Bartholin, Urethra, Skene Glands: Within normal limits             Vagina: No gross lesions or discharge  Cervix: No gross lesions or discharge  Uterus  retroverted, normal size, shape and consistency, non-tender and mobile  Adnexa  Without masses or tenderness  Anus and perineum  normal   Rectovaginal  normal sphincter tone without palpated masses or tenderness             Hemoccult that indicated     Assessment/Plan:  27 y.o. female for annual exam who wanted to have an  STD screen. Her wet prep was negative. GC and chlamydia culture along with hepatitis B and C. As well as RPR and HIV are pending at time of this dictation. A CBC, cholesterol, urinalysis and Pap smear was obtained as well. As a result of her unfaithful partner a Pap smear was done today and if it is negative we will adhere to the new guidelines and proceed with Pap smears every 3 years. She was reminded do her monthly self breast examination. Literature and information  was provided on smoking cessation. We discussed Chantix but she said that it was to call sleep. We are going to get her involved in an tight smoking cessation support group at our hospital. Lid information was also provided on the Mirena IUD as well as on the Nexplanon. Prescription refill for Micronor was provided.    Ok Edwards MD, 5:06 PM 10/25/2012

## 2012-10-26 ENCOUNTER — Other Ambulatory Visit: Payer: Self-pay | Admitting: Gynecology

## 2012-10-26 DIAGNOSIS — E7800 Pure hypercholesterolemia, unspecified: Secondary | ICD-10-CM

## 2012-10-26 LAB — URINALYSIS W MICROSCOPIC + REFLEX CULTURE
Bacteria, UA: NONE SEEN
Casts: NONE SEEN
Glucose, UA: 250 mg/dL — AB
Ketones, ur: NEGATIVE mg/dL
pH: 5.5 (ref 5.0–8.0)

## 2012-10-26 LAB — GC/CHLAMYDIA PROBE AMP: CT Probe RNA: NEGATIVE

## 2012-10-26 LAB — HEPATITIS B SURFACE ANTIGEN: Hepatitis B Surface Ag: NEGATIVE

## 2012-10-27 LAB — URINE CULTURE

## 2012-11-12 ENCOUNTER — Encounter: Payer: Self-pay | Admitting: Gynecology

## 2013-01-10 ENCOUNTER — Encounter (HOSPITAL_COMMUNITY): Payer: Self-pay

## 2013-01-10 ENCOUNTER — Other Ambulatory Visit: Payer: Self-pay | Admitting: Obstetrics and Gynecology

## 2013-01-10 NOTE — H&P (Signed)
27 y.o. yo G1P1001 with MAB measuring 9 weeks with no FHTs on [place] yesterday.  Past Medical History  Diagnosis Date  . H/O trichomoniasis 20o8  No past surgical history on file.  History   Social History  . Marital Status: Single    Spouse Name: N/A    Number of Children: N/A  . Years of Education: N/A   Occupational History  . Not on file.   Social History Main Topics  . Smoking status: Current Every Day Smoker -- 0.50 packs/day    Types: Cigarettes  . Smokeless tobacco: Never Used  . Alcohol Use: Yes     Comment: occ  . Drug Use: No  . Sexual Activity: Yes    Birth Control/ Protection: Pill   Other Topics Concern  . Not on file   Social History Narrative  . No narrative on file    No current facility-administered medications on file prior to encounter.   Current Outpatient Prescriptions on File Prior to Encounter  Medication Sig Dispense Refill  . norethindrone (MICRONOR,CAMILA,ERRIN) 0.35 MG tablet Take 1 tablet (0.35 mg total) by mouth daily.  1 Package  11    No Known Allergies  @VITALS2 @  Lungs: clear to ascultation Cor:  RRR Abdomen:  soft, nontender, nondistended. Ex:  no cords, erythema Pelvic:  10 weeks size uterus, cervix closed  A:  For D&E.  Will check blood type and give Rhogam as necessary.   P: All risks, benefits and alternatives d/w patient and she desires to proceed.   Lilyann Gravelle A

## 2013-01-11 ENCOUNTER — Encounter (HOSPITAL_COMMUNITY): Payer: Self-pay | Admitting: Anesthesiology

## 2013-01-11 ENCOUNTER — Encounter (HOSPITAL_COMMUNITY)
Admission: AD | Disposition: A | Discharge status: Home / Self Care | Payer: Self-pay | Source: Ambulatory Visit | Attending: Obstetrics and Gynecology

## 2013-01-11 ENCOUNTER — Encounter (HOSPITAL_COMMUNITY): Payer: Self-pay | Admitting: Pharmacy Technician

## 2013-01-11 ENCOUNTER — Ambulatory Visit (HOSPITAL_COMMUNITY): Payer: Medicaid Other

## 2013-01-11 ENCOUNTER — Ambulatory Visit (HOSPITAL_COMMUNITY)
Admission: AD | Admit: 2013-01-11 | Discharge: 2013-01-11 | Disposition: A | Discharge status: Home / Self Care | Payer: Medicaid Other | Source: Ambulatory Visit | Attending: Obstetrics and Gynecology | Admitting: Obstetrics and Gynecology

## 2013-01-11 DIAGNOSIS — O021 Missed abortion: Secondary | ICD-10-CM | POA: Insufficient documentation

## 2013-01-11 LAB — ABO/RH: ABO/RH(D): O POS

## 2013-01-11 SURGERY — DILATION AND EVACUATION, UTERUS
Anesthesia: General

## 2013-01-11 MED ORDER — METHYLERGONOVINE MALEATE 0.2 MG/ML IJ SOLN
0.2000 mg | Freq: Once | INTRAMUSCULAR | Status: AC
  Administered 2013-01-11: 0.2 mg via INTRAMUSCULAR
  Filled 2013-01-11: qty 1

## 2013-01-11 MED ORDER — BUTORPHANOL TARTRATE 1 MG/ML IJ SOLN
2.0000 mg | Freq: Once | INTRAMUSCULAR | Status: AC
  Administered 2013-01-11: 2 mg via INTRAMUSCULAR

## 2013-01-11 MED ORDER — BUTORPHANOL TARTRATE 1 MG/ML IJ SOLN
INTRAMUSCULAR | Status: AC
  Filled 2013-01-11: qty 2

## 2013-01-11 MED ORDER — METHYLERGONOVINE MALEATE 0.2 MG/ML IJ SOLN
INTRAMUSCULAR | Status: AC
  Administered 2013-01-11: 0.2 mg via INTRAMUSCULAR
  Filled 2013-01-11: qty 1

## 2013-01-11 MED ORDER — IBUPROFEN 800 MG PO TABS
800.0000 mg | ORAL_TABLET | Freq: Once | ORAL | Status: AC
  Administered 2013-01-11: 800 mg via ORAL
  Filled 2013-01-11: qty 1

## 2013-01-11 SURGICAL SUPPLY — 16 items
CATH ROBINSON RED A/P 16FR (CATHETERS) IMPLANT
CLOTH BEACON ORANGE TIMEOUT ST (SAFETY) IMPLANT
DECANTER SPIKE VIAL GLASS SM (MISCELLANEOUS) IMPLANT
GLOVE BIO SURGEON STRL SZ7 (GLOVE) IMPLANT
GOWN STRL REIN XL XLG (GOWN DISPOSABLE) IMPLANT
KIT BERKELEY 1ST TRIMESTER 3/8 (MISCELLANEOUS) IMPLANT
NS IRRIG 1000ML POUR BTL (IV SOLUTION) IMPLANT
PACK VAGINAL MINOR WOMEN LF (CUSTOM PROCEDURE TRAY) IMPLANT
PAD OB MATERNITY 4.3X12.25 (PERSONAL CARE ITEMS) IMPLANT
PAD PREP 24X48 CUFFED NSTRL (MISCELLANEOUS) IMPLANT
SET BERKELEY SUCTION TUBING (SUCTIONS) IMPLANT
TOWEL OR 17X24 6PK STRL BLUE (TOWEL DISPOSABLE) IMPLANT
VACURETTE 10 RIGID CVD (CANNULA) IMPLANT
VACURETTE 7MM CVD STRL WRAP (CANNULA) IMPLANT
VACURETTE 8 RIGID CVD (CANNULA) IMPLANT
VACURETTE 9 RIGID CVD (CANNULA) IMPLANT

## 2013-01-11 NOTE — Anesthesia Preprocedure Evaluation (Deleted)
Anesthesia Evaluation  Patient identified by MRN, date of birth, ID band Patient awake    Reviewed: Allergy & Precautions, H&P , NPO status , Patient's Chart, lab work & pertinent test results, reviewed documented beta blocker date and time   History of Anesthesia Complications Negative for: history of anesthetic complications  Airway       Dental   Pulmonary Current Smoker,          Cardiovascular negative cardio ROS      Neuro/Psych negative neurological ROS  negative psych ROS   GI/Hepatic negative GI ROS, Neg liver ROS,   Endo/Other  negative endocrine ROS  Renal/GU negative Renal ROS  negative genitourinary   Musculoskeletal   Abdominal   Peds  Hematology negative hematology ROS (+)   Anesthesia Other Findings   Reproductive/Obstetrics (+) Pregnancy (missed ab)                           Anesthesia Physical Anesthesia Plan  ASA: II  Anesthesia Plan: MAC   Post-op Pain Management:    Induction:   Airway Management Planned:   Additional Equipment:   Intra-op Plan:   Post-operative Plan:   Informed Consent: I have reviewed the patients History and Physical, chart, labs and discussed the procedure including the risks, benefits and alternatives for the proposed anesthesia with the patient or authorized representative who has indicated his/her understanding and acceptance.     Plan Discussed with: Surgeon and CRNA  Anesthesia Plan Comments:         Anesthesia Quick Evaluation

## 2013-01-11 NOTE — Progress Notes (Signed)
Spoke to dr. Henderson Cloud and gave her report.. Pt is able to be dc home.

## 2013-01-12 NOTE — Progress Notes (Signed)
Late entry.  Pt passed MAB before 12 on 09-11.  [place] confirmed no longer pregnancy in EM.  I checked pt in Day Surgery and found bleeding to be mild to moderate and cervix closed.  Pt given methergine and toradol/stadol for HA and cramping and sent home.  Blood type Opos- no Rhogam needed.

## 2013-05-02 NOTE — L&D Delivery Note (Signed)
Delivery Note At 1:38 PM a viable and healthy female was delivered via Vaginal, Spontaneous Delivery (Presentation: ; Occiput Anterior).  APGAR: 9, 9; weight 7 lb 15.3 oz (3609 g).   Placenta status: Intact, Spontaneous.  Cord: 3 vessels with the following complications: None.    Anesthesia: Epidural  Episiotomy: None Lacerations: None Suture Repair: n/a Est. Blood Loss (mL): 300  Mom to postpartum.  Baby to Couplet care / Skin to Skin.  Essie Hart STACIA 01/25/2014, 9:14 AM

## 2013-05-13 ENCOUNTER — Ambulatory Visit (INDEPENDENT_AMBULATORY_CARE_PROVIDER_SITE_OTHER): Payer: Medicaid Other | Admitting: Gynecology

## 2013-05-13 ENCOUNTER — Telehealth: Payer: Self-pay | Admitting: *Deleted

## 2013-05-13 ENCOUNTER — Encounter: Payer: Self-pay | Admitting: Gynecology

## 2013-05-13 VITALS — BP 120/76

## 2013-05-13 DIAGNOSIS — N644 Mastodynia: Secondary | ICD-10-CM

## 2013-05-13 NOTE — Telephone Encounter (Signed)
Order placed for breast center, they will contact patient to schedule.

## 2013-05-13 NOTE — Telephone Encounter (Signed)
Message copied by Aura CampsWEBB, Belva Koziel L on Mon May 13, 2013 12:13 PM ------      Message from: Ok EdwardsFERNANDEZ, JUAN H      Created: Mon May 13, 2013 11:23 AM       Please schedule diagnostic mammogram on this patient with very painful left breast in the area of the tail of Spence. No palpable masses and no supraclavicular or axillary lymphadenopathy. ------

## 2013-05-13 NOTE — Patient Instructions (Signed)
Breast Tenderness Breast tenderness is a common problem for women of all ages. Breast tenderness may cause mild discomfort to severe pain. It has a variety of causes. Your health care provider will find out the likely cause of your breast tenderness by examining your breasts, asking you about symptoms, and ordering some tests. Breast tenderness usually does not mean you have breast cancer. HOME CARE INSTRUCTIONS  Breast tenderness often can be handled at home. You can try:  Getting fitted for a new bra that provides more support, especially during exercise.  Wearing a more supportive bra or sports bra while sleeping when your breasts are very tender.  If you have a breast injury, apply ice to the area:  Put ice in a plastic bag.  Place a towel between your skin and the bag.  Leave the ice on for 20 minutes, 2 3 times a day.  If your breasts are too full of milk as a result of breastfeeding, try:  Expressing milk either by hand or with a breast pump.  Applying a warm compress to the breasts for relief.  Taking over-the-counter pain relievers, if approved by your health care provider.  Taking other medicines that your health care provider prescribes. These may include antibiotic medicines or birth control pills. Over the long term, your breast tenderness might be eased if you:  Cut down on caffeine.  Reduce the amount of fat in your diet. Keep a log of the days and times when your breasts are most tender. This will help you and your health care provider find the cause of the tenderness and how to relieve it. Also, learn how to do breast exams at home. This will help you notice if you have an unusual growth or lump that could cause tenderness. SEEK MEDICAL CARE IF:   Any part of your breast is hard, red, and hot to the touch. This could be a sign of infection.  Fluid is coming out of your nipples (and you are not breastfeeding). Especially watch for blood or pus.  You have a fever  as well as breast tenderness.  You have a new or painful lump in your breast that remains after your menstrual period ends.  You have tried to take care of the pain at home, but it has not gone away.  Your breast pain is getting worse, or the pain is making it hard to do the things you usually do during your day. Document Released: 03/31/2008 Document Revised: 12/19/2012 Document Reviewed: 11/15/2012 [employer] Patient Information 2014 [employer].  

## 2013-05-13 NOTE — Progress Notes (Signed)
   28 year old that presented to the office today complaining of left breast tenderness and increased sensitivity over the past month worse the past few days. Patient denies any nipple discharge. Patient does not palpate any masses on self breast examination. Patient with no first one relative of breast cancer. The patient stopped smoking 1 month ago. Patient currently on no medication.  Exam: Both breasts are examined this is supine position. The patient's left breast slightly larger than right as part of her normal development. No nipple inversion no skin discoloration was supraclavicular or axillary lymphadenopathy. Right breasts no palpable masses or tenderness. Left breast was tender towards the tail of Spence but no discernible mass.  Assessment/plan: Patient with worsening left breast pain towards the tail of Spence. No palpable masses. No nipple discharge and no supraclavicular axillary lymphadenopathy. Patient does state that she drinks 3-4 sodas a day and coffee as well. She states it is hard for her to come off of caffeine. 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 We'll send her for a diagnostic mammogram and ultrasound of the left breast for reassurance that there is nothing deeper that was not detected during the exam today. Patient scheduled to return back in June for annual exam. Patient currently not using any form of contraception is to start her menses next few days.

## 2013-05-14 ENCOUNTER — Ambulatory Visit: Payer: Medicaid Other | Admitting: Gynecology

## 2013-05-15 ENCOUNTER — Telehealth: Payer: Self-pay | Admitting: *Deleted

## 2013-05-15 DIAGNOSIS — N644 Mastodynia: Secondary | ICD-10-CM

## 2013-05-15 NOTE — Telephone Encounter (Signed)
She would not be a candidate for a diagnostic mammogram if she is pregnant. She may have a breast ultrasound instead. Make sure she begins taking prenatal vitamins. She will need to followup with an appointment here in the office in a few weeks.

## 2013-05-15 NOTE — Telephone Encounter (Signed)
Ultrasound ordered, pt informed with the below note. Pt said she has [facility]

## 2013-05-15 NOTE — Telephone Encounter (Signed)
Pt was seen on 05/13/13 c/o breast pain, scheduled for diag. mammo at breast center. Pt said she found out yesterday she is preganant, pt asked if she should keep diag. Mammogram? Or should patient have ultrasound only? Please advise

## 2013-05-15 NOTE — Telephone Encounter (Signed)
New order placed for ultrasound.

## 2013-05-27 ENCOUNTER — Ambulatory Visit
Admission: RE | Admit: 2013-05-27 | Discharge: 2013-05-27 | Disposition: A | Discharge status: Home / Self Care | Payer: Medicaid Other | Source: Ambulatory Visit | Attending: Gynecology | Admitting: Gynecology

## 2013-05-27 DIAGNOSIS — N644 Mastodynia: Secondary | ICD-10-CM

## 2013-05-28 ENCOUNTER — Ambulatory Visit
Admission: RE | Admit: 2013-05-28 | Discharge: 2013-05-28 | Disposition: A | Discharge status: Home / Self Care | Payer: Medicaid Other | Source: Ambulatory Visit | Attending: Gynecology | Admitting: Gynecology

## 2013-05-28 DIAGNOSIS — N644 Mastodynia: Secondary | ICD-10-CM

## 2013-05-28 NOTE — Telephone Encounter (Signed)
appt 05/28/13 @ 3:30 pm

## 2013-06-13 ENCOUNTER — Other Ambulatory Visit: Payer: Self-pay | Admitting: Obstetrics and Gynecology

## 2013-07-09 LAB — OB RESULTS CONSOLE RUBELLA ANTIBODY, IGM: Rubella: IMMUNE

## 2013-07-09 LAB — OB RESULTS CONSOLE GC/CHLAMYDIA
Chlamydia: NEGATIVE
GC PROBE AMP, GENITAL: NEGATIVE

## 2013-07-09 LAB — OB RESULTS CONSOLE HIV ANTIBODY (ROUTINE TESTING): HIV: NONREACTIVE

## 2013-07-09 LAB — OB RESULTS CONSOLE ABO/RH: RH Type: POSITIVE

## 2013-07-09 LAB — OB RESULTS CONSOLE ANTIBODY SCREEN: Antibody Screen: NEGATIVE

## 2013-10-25 LAB — OB RESULTS CONSOLE RPR: RPR: NONREACTIVE

## 2013-10-31 LAB — OB RESULTS CONSOLE RPR: RPR: NONREACTIVE

## 2013-12-17 LAB — OB RESULTS CONSOLE GBS: GBS: NEGATIVE

## 2014-01-18 ENCOUNTER — Inpatient Hospital Stay (HOSPITAL_COMMUNITY)
Admission: AD | Admit: 2014-01-18 | Payer: Medicaid Other | Source: Ambulatory Visit | Admitting: Obstetrics and Gynecology

## 2014-01-24 ENCOUNTER — Inpatient Hospital Stay (HOSPITAL_COMMUNITY)
Admission: RE | Admit: 2014-01-24 | Discharge: 2014-01-26 | DRG: 775 | Disposition: A | Discharge status: Home / Self Care | Payer: Medicaid Other | Source: Ambulatory Visit | Attending: Obstetrics & Gynecology | Admitting: Obstetrics & Gynecology

## 2014-01-24 ENCOUNTER — Encounter (HOSPITAL_COMMUNITY): Payer: Medicaid Other | Admitting: Anesthesiology

## 2014-01-24 ENCOUNTER — Inpatient Hospital Stay (HOSPITAL_COMMUNITY): Payer: Medicaid Other | Admitting: Anesthesiology

## 2014-01-24 ENCOUNTER — Encounter (HOSPITAL_COMMUNITY): Payer: Self-pay

## 2014-01-24 DIAGNOSIS — Z8249 Family history of ischemic heart disease and other diseases of the circulatory system: Secondary | ICD-10-CM | POA: Diagnosis not present

## 2014-01-24 DIAGNOSIS — Z803 Family history of malignant neoplasm of breast: Secondary | ICD-10-CM | POA: Diagnosis not present

## 2014-01-24 DIAGNOSIS — Z833 Family history of diabetes mellitus: Secondary | ICD-10-CM | POA: Diagnosis not present

## 2014-01-24 DIAGNOSIS — O480 Post-term pregnancy: Principal | ICD-10-CM | POA: Diagnosis present

## 2014-01-24 LAB — TYPE AND SCREEN
ABO/RH(D): O POS
Antibody Screen: NEGATIVE

## 2014-01-24 LAB — CBC
HCT: 40.3 % (ref 36.0–46.0)
Hemoglobin: 13.6 g/dL (ref 12.0–15.0)
MCH: 30.1 pg (ref 26.0–34.0)
MCHC: 33.7 g/dL (ref 30.0–36.0)
MCV: 89.2 fL (ref 78.0–100.0)
Platelets: 147 10*3/uL — ABNORMAL LOW (ref 150–400)
RBC: 4.52 MIL/uL (ref 3.87–5.11)
RDW: 13.6 % (ref 11.5–15.5)
WBC: 10 10*3/uL (ref 4.0–10.5)

## 2014-01-24 MED ORDER — INFLUENZA VAC SPLIT QUAD 0.5 ML IM SUSY
0.5000 mL | PREFILLED_SYRINGE | INTRAMUSCULAR | Status: AC
  Administered 2014-01-25: 0.5 mL via INTRAMUSCULAR
  Filled 2014-01-24: qty 0.5

## 2014-01-24 MED ORDER — EPHEDRINE 5 MG/ML INJ
10.0000 mg | INTRAVENOUS | Status: DC | PRN
  Filled 2014-01-24: qty 2

## 2014-01-24 MED ORDER — LACTATED RINGERS IV SOLN
INTRAVENOUS | Status: DC
  Administered 2014-01-24: 08:00:00 via INTRAVENOUS

## 2014-01-24 MED ORDER — PNEUMOCOCCAL VAC POLYVALENT 25 MCG/0.5ML IJ INJ
0.5000 mL | INJECTION | INTRAMUSCULAR | Status: AC
  Administered 2014-01-26: 0.5 mL via INTRAMUSCULAR
  Filled 2014-01-24 (×2): qty 0.5

## 2014-01-24 MED ORDER — TERBUTALINE SULFATE 1 MG/ML IJ SOLN: 0.2500 mg | Freq: Once | INTRAMUSCULAR | Status: AC | PRN

## 2014-01-24 MED ORDER — PHENYLEPHRINE 40 MCG/ML (10ML) SYRINGE FOR IV PUSH (FOR BLOOD PRESSURE SUPPORT)
80.0000 ug | PREFILLED_SYRINGE | INTRAVENOUS | Status: DC | PRN
  Filled 2014-01-24: qty 10
  Filled 2014-01-24: qty 2

## 2014-01-24 MED ORDER — OXYCODONE-ACETAMINOPHEN 5-325 MG PO TABS
1.0000 | ORAL_TABLET | ORAL | Status: DC | PRN
Start: 2014-01-24 — End: 2014-01-24

## 2014-01-24 MED ORDER — SENNOSIDES-DOCUSATE SODIUM 8.6-50 MG PO TABS
2.0000 | ORAL_TABLET | ORAL | Status: DC
  Administered 2014-01-24 – 2014-01-25 (×2): 2 via ORAL
  Filled 2014-01-24 (×2): qty 2

## 2014-01-24 MED ORDER — LANOLIN HYDROUS EX OINT: TOPICAL_OINTMENT | CUTANEOUS | Status: DC | PRN

## 2014-01-24 MED ORDER — PHENYLEPHRINE 40 MCG/ML (10ML) SYRINGE FOR IV PUSH (FOR BLOOD PRESSURE SUPPORT)
80.0000 ug | PREFILLED_SYRINGE | INTRAVENOUS | Status: DC | PRN
  Filled 2014-01-24: qty 2

## 2014-01-24 MED ORDER — ZOLPIDEM TARTRATE 5 MG PO TABS: 5.0000 mg | ORAL_TABLET | Freq: Every evening | ORAL | Status: DC | PRN

## 2014-01-24 MED ORDER — OXYTOCIN 40 UNITS IN LACTATED RINGERS INFUSION - SIMPLE MED
1.0000 m[IU]/min | INTRAVENOUS | Status: DC
  Administered 2014-01-24: 2 m[IU]/min via INTRAVENOUS
  Filled 2014-01-24: qty 1000

## 2014-01-24 MED ORDER — ONDANSETRON HCL 4 MG/2ML IJ SOLN: 4.0000 mg | INTRAMUSCULAR | Status: DC | PRN

## 2014-01-24 MED ORDER — DIPHENHYDRAMINE HCL 25 MG PO CAPS: 25.0000 mg | ORAL_CAPSULE | Freq: Four times a day (QID) | ORAL | Status: DC | PRN

## 2014-01-24 MED ORDER — LIDOCAINE HCL (PF) 1 % IJ SOLN
30.0000 mL | INTRAMUSCULAR | Status: DC | PRN
  Filled 2014-01-24: qty 30

## 2014-01-24 MED ORDER — FENTANYL 2.5 MCG/ML BUPIVACAINE 1/10 % EPIDURAL INFUSION (WH - ANES)
14.0000 mL/h | INTRAMUSCULAR | Status: DC | PRN
  Administered 2014-01-24: 14 mL/h via EPIDURAL
  Filled 2014-01-24: qty 125

## 2014-01-24 MED ORDER — FENTANYL 2.5 MCG/ML BUPIVACAINE 1/10 % EPIDURAL INFUSION (WH - ANES)
INTRAMUSCULAR | Status: DC | PRN
  Administered 2014-01-24: 14 mL/h via EPIDURAL

## 2014-01-24 MED ORDER — LIDOCAINE HCL (PF) 1 % IJ SOLN
INTRAMUSCULAR | Status: DC | PRN
Start: 2014-01-24 — End: 2014-01-25
  Administered 2014-01-24 (×2): 4 mL

## 2014-01-24 MED ORDER — OXYTOCIN BOLUS FROM INFUSION
500.0000 mL | INTRAVENOUS | Status: DC
  Administered 2014-01-24: 500 mL via INTRAVENOUS

## 2014-01-24 MED ORDER — OXYTOCIN 40 UNITS IN LACTATED RINGERS INFUSION - SIMPLE MED
62.5000 mL/h | INTRAVENOUS | Status: DC | PRN
Start: 2014-01-24 — End: 2014-01-26

## 2014-01-24 MED ORDER — PRENATAL MULTIVITAMIN CH
1.0000 | ORAL_TABLET | Freq: Every day | ORAL | Status: DC
  Administered 2014-01-25 – 2014-01-26 (×2): 1 via ORAL
  Filled 2014-01-24 (×2): qty 1

## 2014-01-24 MED ORDER — DIPHENHYDRAMINE HCL 50 MG/ML IJ SOLN: 12.5000 mg | INTRAMUSCULAR | Status: DC | PRN

## 2014-01-24 MED ORDER — ONDANSETRON HCL 4 MG PO TABS: 4.0000 mg | ORAL_TABLET | ORAL | Status: DC | PRN

## 2014-01-24 MED ORDER — OXYCODONE-ACETAMINOPHEN 5-325 MG PO TABS: 2.0000 | ORAL_TABLET | ORAL | Status: DC | PRN

## 2014-01-24 MED ORDER — DIBUCAINE 1 % RE OINT: 1.0000 "application " | TOPICAL_OINTMENT | RECTAL | Status: DC | PRN

## 2014-01-24 MED ORDER — IBUPROFEN 600 MG PO TABS
600.0000 mg | ORAL_TABLET | Freq: Four times a day (QID) | ORAL | Status: DC
  Administered 2014-01-24 – 2014-01-26 (×8): 600 mg via ORAL
  Filled 2014-01-24 (×8): qty 1

## 2014-01-24 MED ORDER — TETANUS-DIPHTH-ACELL PERTUSSIS 5-2.5-18.5 LF-MCG/0.5 IM SUSP
0.5000 mL | Freq: Once | INTRAMUSCULAR | Status: AC
  Administered 2014-01-25: 0.5 mL via INTRAMUSCULAR

## 2014-01-24 MED ORDER — CITRIC ACID-SODIUM CITRATE 334-500 MG/5ML PO SOLN: 30.0000 mL | ORAL | Status: DC | PRN

## 2014-01-24 MED ORDER — LACTATED RINGERS IV SOLN: 500.0000 mL | INTRAVENOUS | Status: DC | PRN

## 2014-01-24 MED ORDER — NALBUPHINE HCL 10 MG/ML IJ SOLN
10.0000 mg | INTRAMUSCULAR | Status: DC | PRN
  Administered 2014-01-24: 10 mg via INTRAVENOUS
  Filled 2014-01-24: qty 1

## 2014-01-24 MED ORDER — ONDANSETRON HCL 4 MG/2ML IJ SOLN: 4.0000 mg | Freq: Four times a day (QID) | INTRAMUSCULAR | Status: DC | PRN

## 2014-01-24 MED ORDER — OXYCODONE-ACETAMINOPHEN 5-325 MG PO TABS
1.0000 | ORAL_TABLET | ORAL | Status: DC | PRN
  Administered 2014-01-24 – 2014-01-26 (×6): 1 via ORAL
  Filled 2014-01-24 (×6): qty 1

## 2014-01-24 MED ORDER — OXYTOCIN 40 UNITS IN LACTATED RINGERS INFUSION - SIMPLE MED
62.5000 mL/h | INTRAVENOUS | Status: DC
  Administered 2014-01-24: 62.5 mL/h via INTRAVENOUS

## 2014-01-24 MED ORDER — LACTATED RINGERS IV SOLN: 500.0000 mL | Freq: Once | INTRAVENOUS | Status: DC

## 2014-01-24 MED ORDER — WITCH HAZEL-GLYCERIN EX PADS
1.0000 | MEDICATED_PAD | CUTANEOUS | Status: DC | PRN
Start: 2014-01-24 — End: 2014-01-26

## 2014-01-24 MED ORDER — SIMETHICONE 80 MG PO CHEW: 80.0000 mg | CHEWABLE_TABLET | ORAL | Status: DC | PRN

## 2014-01-24 MED ORDER — BENZOCAINE-MENTHOL 20-0.5 % EX AERO
1.0000 "application " | INHALATION_SPRAY | CUTANEOUS | Status: DC | PRN
  Administered 2014-01-24 – 2014-01-26 (×2): 1 via TOPICAL
  Filled 2014-01-24 (×3): qty 56

## 2014-01-24 MED ORDER — ACETAMINOPHEN 325 MG PO TABS: 650.0000 mg | ORAL_TABLET | ORAL | Status: DC | PRN

## 2014-01-24 NOTE — Anesthesia Preprocedure Evaluation (Signed)

## 2014-01-24 NOTE — H&P (Signed)
Jamie Glenn is a 28 y.o. female presenting for induction of labor for post dates.  Maternal Medical History:  Reason for admission: Contractions.   Contractions: Onset was 3-5 hours ago.   Frequency: irregular.    Fetal activity: Perceived fetal activity is normal.   Last perceived fetal movement was within the past 12 hours.    Prenatal complications: no prenatal complications Prenatal Complications - Diabetes: none.    OB History   Grav Para Term Preterm Abortions TAB SAB Ect Mult Living   Past Medical History  Diagnosis Date  . H/O trichomoniasis 20o8   Past Surgical History  Procedure Laterality Date  . Wisdom tooth extraction     Family History: family history includes Breast cancer in her maternal grandmother; Cancer in her maternal grandmother and paternal uncle; Diabetes in her maternal aunt; Hypertension in her father and mother. Social History:  reports that she has never smoked. She has never used smokeless tobacco. She reports that she drinks alcohol. She reports that she does not use illicit drugs.   Prenatal Transfer Tool  Maternal Diabetes: No Genetic Screening: Normal Maternal Ultrasounds/Referrals: Normal Fetal Ultrasounds or other Referrals:  Other: Anatomy scan normal Maternal Substance Abuse:  No Significant Maternal Medications:  Meds include: Other: Prozac early in pregnancy then stopped Significant Maternal Lab Results:  Lab values include: Group B Strep negative Other Comments:  None  Review of Systems  Constitutional: Negative for fever and chills.  Eyes: Negative for blurred vision and double vision.  Respiratory: Negative for cough.   Cardiovascular: Negative for chest pain.  Gastrointestinal: Negative for heartburn.  Genitourinary: Negative for dysuria.  Musculoskeletal: Negative for myalgias.  Skin: Negative for itching and rash.  Neurological: Negative for dizziness and headaches.  Endo/Heme/Allergies: Does not  bruise/bleed easily.    Dilation: 2.5 Effacement (%): 70 Station: -2 Exam by:: Dr Mora Appl Blood pressure 127/72, pulse 83, temperature 98.5 F (36.9 C), temperature source Axillary, resp. rate 18, height 5' 3.5" (1.613 m), weight 77.111 kg (170 lb), last menstrual period 04/13/2013, unknown if currently breastfeeding. Maternal Exam:  Uterine Assessment: Contraction strength is moderate.  Contraction duration is 60 seconds. Contraction frequency is irregular.   Abdomen: Patient reports no abdominal tenderness. Fundal height is 40 cm.   Estimated fetal weight is 3200 grams.   Fetal presentation: vertex  Introitus: Normal vulva. Normal vagina.  Ferning test: not done.  Nitrazine test: not done. Amniotic fluid character: not assessed.  Pelvis: adequate for delivery.   Cervix: Cervix evaluated by digital exam.   2-3cm / 70%/-2  Fetal Exam Fetal Monitor Review: Mode: hand-held doppler probe.   Baseline rate: 145.  Variability: moderate (6-25 bpm).   Pattern: accelerations present and no decelerations.    Fetal State Assessment: Category I - tracings are normal.     Physical Exam  Nursing note and vitals reviewed. Constitutional: She is oriented to person, place, and time. She appears well-developed and well-nourished.  HENT:  Head: Normocephalic.  Eyes: Pupils are equal, round, and reactive to light.  Cardiovascular: Normal rate and regular rhythm.   Respiratory: Effort normal.  GI: Soft.  Genitourinary: Vagina normal and uterus normal.  Musculoskeletal: Normal range of motion.  Neurological: She is alert and oriented to person, place, and time.    Prenatal labs: ABO, Rh:  O+ Antibody:negative   Rubella:  Immune RPR:   NR  HBsAg:   Neg HIV:  Neg GBS:   Neg  Assessment/Plan: 28 yo G3P1 at 40 weeks 6 days presents for IOL Continuous monitoring Pitocin for induction Epidural on demand    Jamie Glenn STACIA 01/24/2014, 8:22 AM

## 2014-01-24 NOTE — Anesthesia Procedure Notes (Signed)
Epidural Patient location during procedure: OB Start time: 01/24/2014 11:18 AM  Staffing Anesthesiologist: Jajaira Ruis A. Performed by: anesthesiologist   Preanesthetic Checklist Completed: patient identified, site marked, surgical consent, pre-op evaluation, timeout performed, IV checked, risks and benefits discussed and monitors and equipment checked  Epidural Patient position: sitting Prep: site prepped and draped and DuraPrep Patient monitoring: continuous pulse ox and blood pressure Approach: midline Location: L3-L4 Injection technique: LOR air  Needle:  Needle type: Tuohy  Needle gauge: 17 G Needle length: 9 cm and 9 Needle insertion depth: 5 cm cm Catheter type: closed end flexible Catheter size: 19 Gauge Catheter at skin depth: 10 cm Test dose: negative and Other  Assessment Events: blood not aspirated, injection not painful, no injection resistance, negative IV test and no paresthesia  Additional Notes Patient identified. Risks and benefits discussed including failed block, incomplete  Pain control, post dural puncture headache, nerve damage, paralysis, blood pressure Changes, nausea, vomiting, reactions to medications-both toxic and allergic and post Partum back pain. All questions were answered. Patient expressed understanding and wished to proceed. Sterile technique was used throughout procedure. Epidural site was Dressed with sterile barrier dressing. No paresthesias, signs of intravascular injection Or signs of intrathecal spread were encountered.  Patient was more comfortable after the epidural was dosed. Please see RN's note for documentation of vital signs and FHR which are stable.

## 2014-01-24 NOTE — Progress Notes (Signed)
Patient to finish first bag of pitocin and RN to hang one more new bag per MD. Will report to [facility] RN.

## 2014-01-25 LAB — CBC
HCT: 35.5 % — ABNORMAL LOW (ref 36.0–46.0)
Hemoglobin: 12 g/dL (ref 12.0–15.0)
MCH: 30.1 pg (ref 26.0–34.0)
MCHC: 33.8 g/dL (ref 30.0–36.0)
MCV: 89 fL (ref 78.0–100.0)
PLATELETS: 136 10*3/uL — AB (ref 150–400)
RBC: 3.99 MIL/uL (ref 3.87–5.11)
RDW: 13.6 % (ref 11.5–15.5)
WBC: 13.5 10*3/uL — ABNORMAL HIGH (ref 4.0–10.5)

## 2014-01-25 NOTE — Progress Notes (Signed)
Post Partum Day 1 Subjective: up ad lib, voiding, tolerating PO, + flatus and breast feeding  Objective: Blood pressure 113/69, pulse 64, temperature 97.4 F (36.3 C), temperature source Oral, resp. rate 18, height 5' 3.5" (1.613 m), weight 77.111 kg (170 lb), last menstrual period 04/13/2013, SpO2 100.00%, unknown if currently breastfeeding.  Physical Exam:  General: alert, cooperative and no distress Lochia: appropriate Uterine Fundus: firm DVT Evaluation: No evidence of DVT seen on physical exam. Negative Homan's sign. No cords or calf tenderness.   Recent Labs  01/24/14 0818 01/25/14 0612  HGB 13.6 12.0  HCT 40.3 35.5*    Assessment/Plan: Plan for discharge tomorrow and Breastfeeding   LOS: 1 day   Torrion Witter STACIA 01/25/2014, 9:15 AM

## 2014-01-25 NOTE — Progress Notes (Signed)
Pt complained of not getting any sleep and asked how many times the nurse had to come into the room to check the infant. I told her I would not disturb her after 1930 unless she called me into the room for something. I did inform mother that the infant would have to be weighed and assessed at 2330 or so and that it was important to remember the times the baby fed and the diapers for the pediatrician.

## 2014-01-25 NOTE — Anesthesia Postprocedure Evaluation (Signed)
  Anesthesia Post-op Note  Patient: Jamie Glenn  Procedure(s) Performed: * No procedures listed *  Patient Location: Mother/Baby  Anesthesia Type:Epidural  Level of Consciousness: awake and alert   Airway and Oxygen Therapy: Patient Spontanous Breathing  Post-op Pain: mild  Post-op Assessment: Post-op Vital signs reviewed, Patient's Cardiovascular Status Stable, Respiratory Function Stable, No signs of Nausea or vomiting, Pain level controlled, No headache, No residual numbness and No residual motor weakness  Post-op Vital Signs: Reviewed  Last Vitals:  Filed Vitals:   01/25/14 0612  BP: 113/69  Pulse: 64  Temp: 36.3 C  Resp: 18    Complications: No apparent anesthesia complications

## 2014-01-26 MED ORDER — SENNOSIDES-DOCUSATE SODIUM 8.6-50 MG PO TABS: 2.0000 | ORAL_TABLET | Freq: Every evening | ORAL | Status: DC | PRN

## 2014-01-26 MED ORDER — IBUPROFEN 600 MG PO TABS: 600.0000 mg | ORAL_TABLET | Freq: Four times a day (QID) | ORAL | Status: DC | PRN

## 2014-01-26 NOTE — Discharge Summary (Signed)
Obstetric Discharge Summary Reason for Admission: onset of labor Prenatal Procedures: none Intrapartum Procedures: spontaneous vaginal delivery Postpartum Procedures: none Complications-Operative and Postpartum: none Hemoglobin  Date Value Ref Range Status  01/25/2014 12.0  12.0 - 15.0 g/dL Final     HCT  Date Value Ref Range Status  01/25/2014 35.5* 36.0 - 46.0 % Final    Physical Exam:  General: alert, cooperative and no distress Lochia: appropriate Uterine Fundus: firm DVT Evaluation: No evidence of DVT seen on physical exam. Negative Homan's sign. No cords or calf tenderness.  Discharge Diagnoses: Term Pregnancy-delivered  Discharge Information: Date: 01/26/2014 Activity: pelvic rest Diet: routine Medications: PNV, Ibuprofen and Colace Condition: stable Instructions: refer to practice specific booklet Discharge to: home   Newborn Data: Live born female  Birth Weight: 7 lb 15.3 oz (3609 g) APGAR: 9, 9  Home with mother.  Essie Hart STACIA 01/26/2014, 10:15 AM

## 2014-01-26 NOTE — Progress Notes (Signed)
Post Partum Day 2 Subjective: no complaints, up ad lib, voiding, tolerating PO and + flatus  Objective: Blood pressure 112/60, pulse 82, temperature 98.3 F (36.8 C), temperature source Oral, resp. rate 18, height 5' 3.5" (1.613 m), weight 77.111 kg (170 lb), last menstrual period 04/13/2013, SpO2 99.00%, unknown if currently breastfeeding.  Physical Exam:  General: alert, cooperative and no distress Lochia: appropriate Uterine Fundus: firm DVT Evaluation: No evidence of DVT seen on physical exam. Negative Homan's sign. No cords or calf tenderness.   Recent Labs  01/24/14 0818 01/25/14 0612  HGB 13.6 12.0  HCT 40.3 35.5*    Assessment/Plan: Discharge home, Breastfeeding and Contraception will discuss at post partum visit   LOS: 2 days   Jasraj Lappe STACIA 01/26/2014, 10:11 AM

## 2014-01-29 NOTE — Progress Notes (Signed)
Post discharge chart review completed.  

## 2014-03-03 ENCOUNTER — Encounter (HOSPITAL_COMMUNITY): Payer: Self-pay

## 2014-05-15 ENCOUNTER — Encounter (HOSPITAL_COMMUNITY): Payer: Self-pay | Admitting: Obstetrics and Gynecology

## 2014-11-08 IMAGING — US US OB COMP LESS 14 WK
1 series · 14 of 28 positions shown · non-contrast
Comparison: None.

CLINICAL DATA: Vaginal bleeding. Eleven week 1 day gestational age
by LMP.

EXAM:
OBSTETRIC <14 WK US AND TRANSVAGINAL OB US
TECHNIQUE: Both transabdominal and transvaginal ultrasound examinations were
performed for complete evaluation of the gestation as well as the
maternal uterus, adnexal regions, and pelvic cul-de-sac.
Transvaginal technique was performed to assess early pregnancy.

[Series 1: us ob comp less 14 wks · 33 acquisitions, 14 frames shown]
[im 2/33]
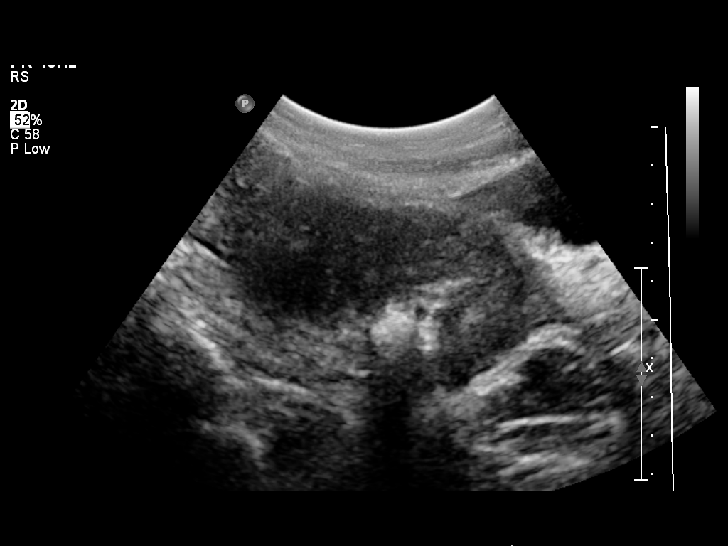
[im 4/33]
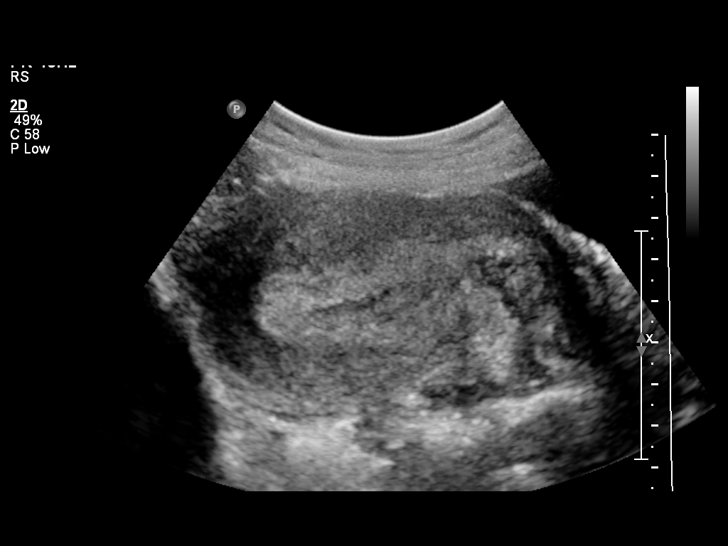
[im 6/33]
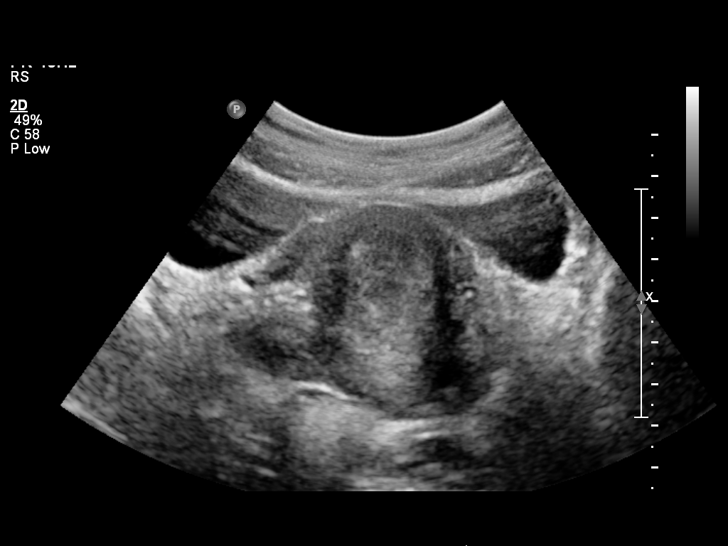
[im 9/33]
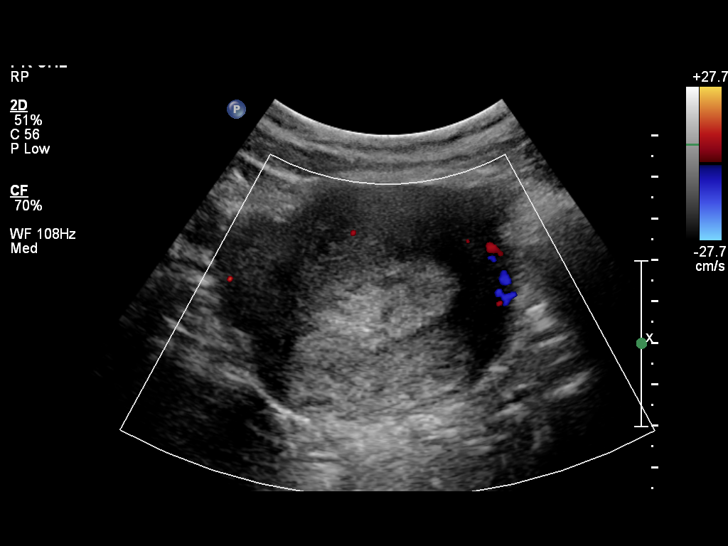
[im 11/33]
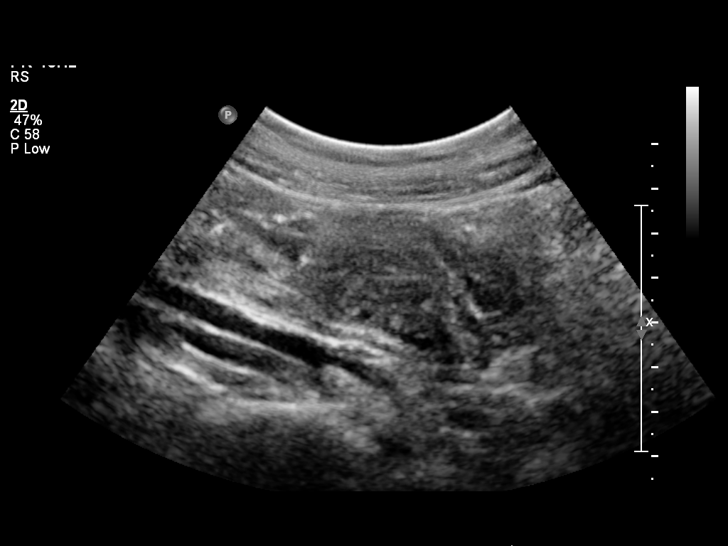
[im 14/33]
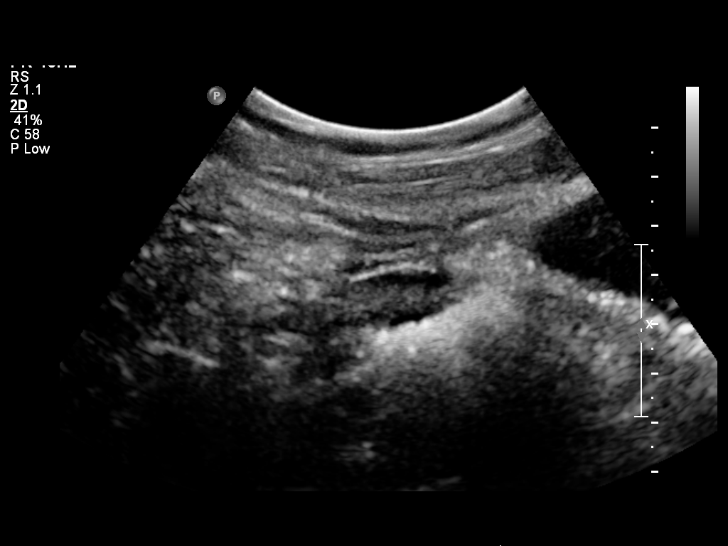
[im 16/33]
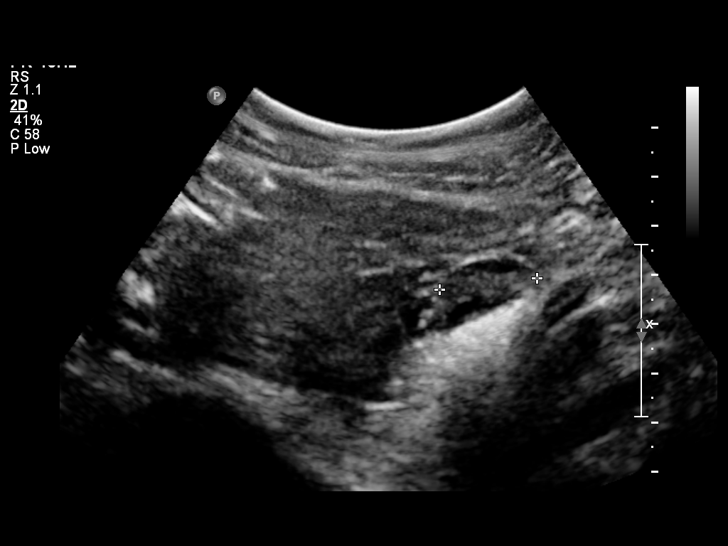
[im 18/33]
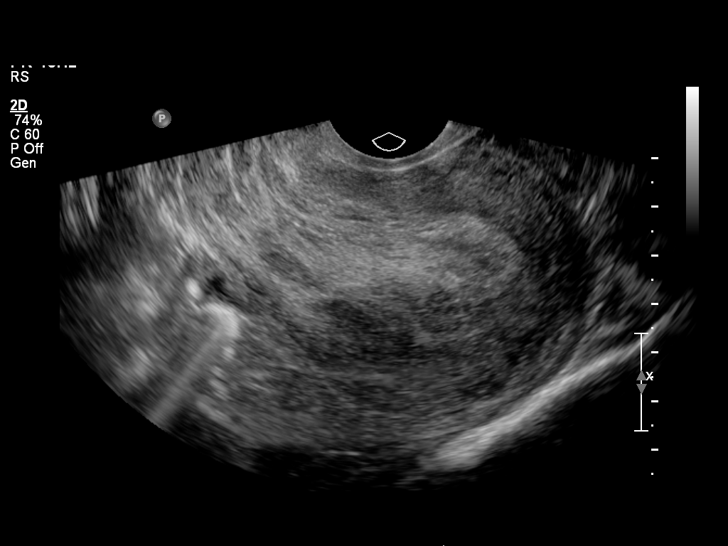
[im 21/33]
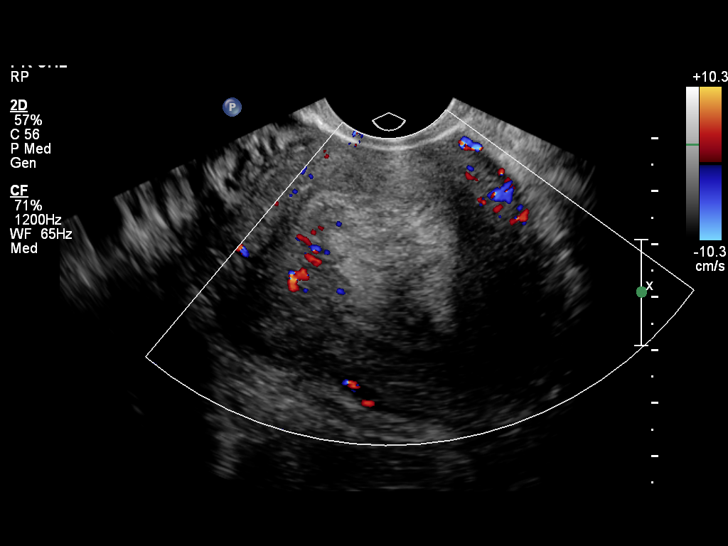
[im 23/33]
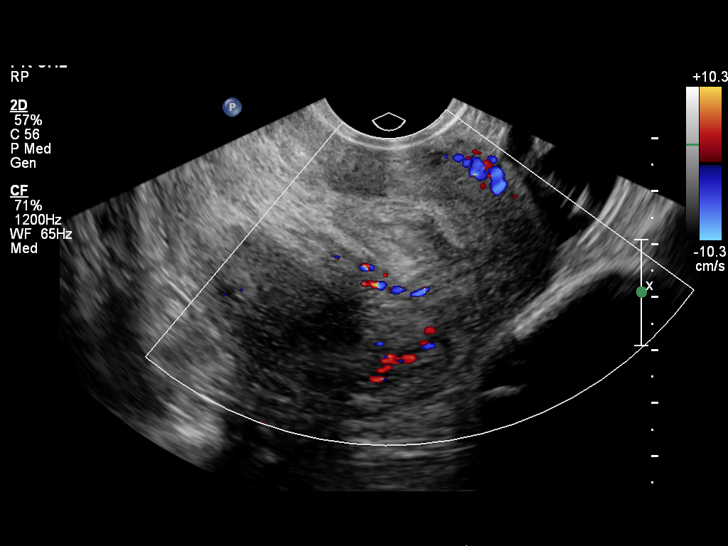
[im 25/33]
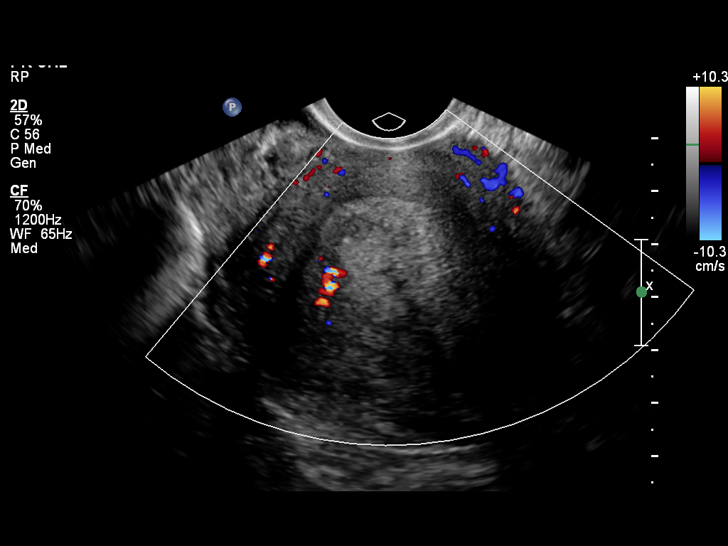
[im 28/33]
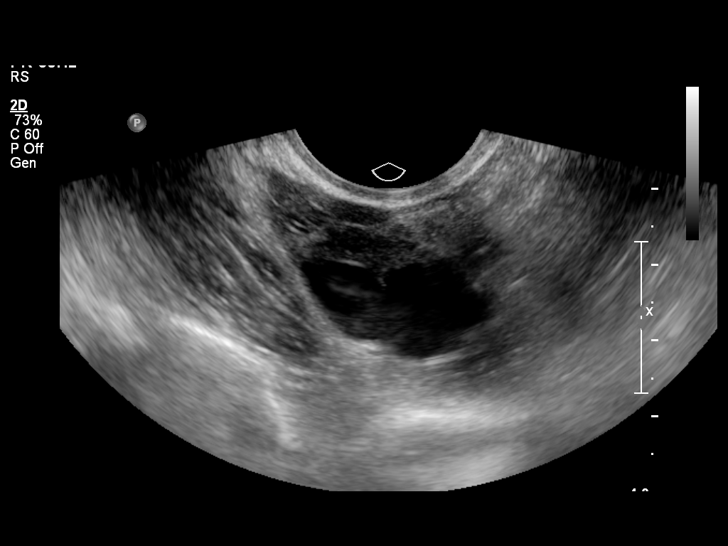
[im 30/33]
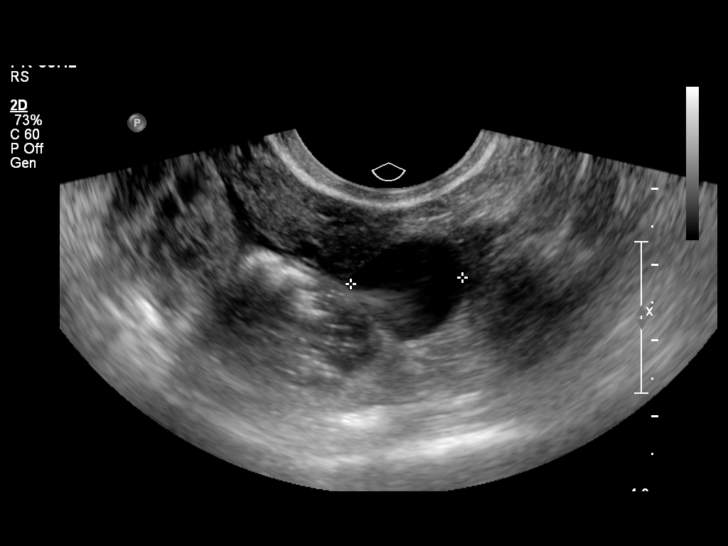
[im 33/33]
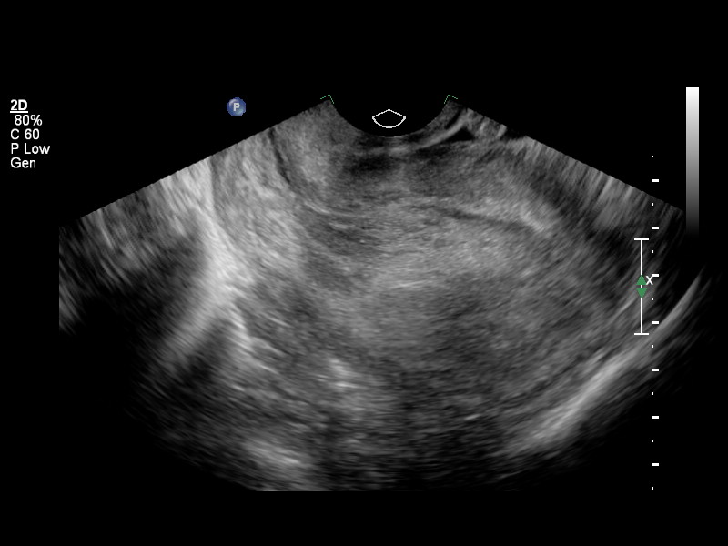

[14 of 28 positions shown; findings below may reference images not displayed]

FINDINGS: No intrauterine gestational sac identified. Thickened endometrium
noted with small amount of fluid or blood throughout the endometrial
cavity. Endometrial thickness measures 2.7 cm. No fibroids
identified.

Both ovaries are normal in appearance. A small benign appearing
right parovarian cyst noted, but no adnexal mass or free fluid
identified.
IMPRESSION: No IUP or adnexal mass identified. Differential diagnosis includes
recent spontaneous abortion, IUP too early to visualize, and occult
ectopic pregnancy. Recommend close follow up of quantitative B-HCG
levels, and follow up US as clinically warranted.

## 2014-11-26 LAB — OB RESULTS CONSOLE HIV ANTIBODY (ROUTINE TESTING): HIV: NONREACTIVE

## 2015-03-08 ENCOUNTER — Emergency Department (INDEPENDENT_AMBULATORY_CARE_PROVIDER_SITE_OTHER)
Admission: EM | Admit: 2015-03-08 | Discharge: 2015-03-08 | Disposition: A | Discharge status: Home / Self Care | Payer: Medicaid Other | Source: Home / Self Care

## 2015-03-08 ENCOUNTER — Emergency Department (INDEPENDENT_AMBULATORY_CARE_PROVIDER_SITE_OTHER): Payer: Medicaid Other

## 2015-03-08 ENCOUNTER — Encounter (HOSPITAL_COMMUNITY): Payer: Self-pay | Admitting: Emergency Medicine

## 2015-03-08 DIAGNOSIS — R0781 Pleurodynia: Secondary | ICD-10-CM

## 2015-03-08 NOTE — ED Provider Notes (Signed)
CSN: [id number]     Arrival date & time 03/08/15  1305 History   None    Chief Complaint  Patient presents with  . Rib Injury   (Consider location/radiation/quality/duration/timing/severity/associated sxs/prior Treatment) HPI History obtained from patient:   LOCATION:left posterior ribs SEVERITY:8 DURATION:since Friday CONTEXT:fall QUALITY:sore to move MODIFYING FACTORS:tylenol without relief of symptoms ASSOCIATED SYMPTOMS:difficulty with cough TIMING:constant   Past Medical History  Diagnosis Date  . H/O trichomoniasis 20o8   Past Surgical History  Procedure Laterality Date  . Wisdom tooth extraction     Family History  Problem Relation Age of Onset  . Hypertension Mother   . Hypertension Father   . Diabetes Maternal Aunt   . Cancer Paternal Uncle   . Breast cancer Maternal Grandmother   . Cancer Maternal Grandmother     skin   Social History  Substance Use Topics  . Smoking status: Never Smoker   . Smokeless tobacco: Never Used  . Alcohol Use: Yes     Comment: occ   OB History    Gravida Para Term Preterm AB TAB SAB Ectopic Multiple Living   3 2 2       2      Review of Systems ROS +'ve rib pain  Denies: HEADACHE, NAUSEA, ABDOMINAL PAIN, CHEST PAIN, CONGESTION, DYSURIA, SHORTNESS OF BREATH  Allergies  Review of patient's allergies indicates no known allergies.  Home Medications   Prior to Admission medications   Medication Sig Start Date End Date Taking? Authorizing Provider  acetaminophen (TYLENOL) 325 MG tablet Take 325 mg by mouth every 6 (six) hours as needed for mild pain.    Historical Provider, MD  diphenhydrAMINE (BENADRYL) 25 MG tablet Take 25 mg by mouth at bedtime as needed for sleep.    Historical Provider, MD  ibuprofen (ADVIL,MOTRIN) 600 MG tablet Take 1 tablet (600 mg total) by mouth every 6 (six) hours as needed for moderate pain. 01/26/14   Essie HartWalda Pinn, MD  Prenatal Vit-Fe Fumarate-FA (PRENATAL MULTIVITAMIN) TABS tablet Take 1 tablet  by mouth daily at 12 noon.    Historical Provider, MD  senna-docusate (SENOKOT-S) 8.6-50 MG per tablet Take 2 tablets by mouth at bedtime as needed for mild constipation or moderate constipation. 01/26/14   Essie HartWalda Pinn, MD   Meds Ordered and Administered this Visit  Medications - No data to display  BP 138/81 mmHg  Pulse 80  Temp(Src) 98.5 F (36.9 C) (Oral)  Resp 18  SpO2 100% No data found.   Physical Exam  Constitutional: She is oriented to person, place, and time. She appears well-developed and well-nourished.  HENT:  Head: Normocephalic and atraumatic.  Pulmonary/Chest: Effort normal and breath sounds normal.   She exhibits tenderness.  Musculoskeletal: She exhibits tenderness.  See chest  No crepitus  Neurological: She is alert and oriented to person, place, and time.  Skin: Skin is warm and dry.  Psychiatric: She has a normal mood and affect. Her behavior is normal. Judgment and thought content normal.  Nursing note and vitals reviewed.   ED Course  Procedures (including critical care time)  Labs Review Labs Reviewed - No data to display  Imaging Review Dg Chest 2 View  03/08/2015  CLINICAL DATA:  Left posterior rib pain after recent fall EXAM: CHEST  2 VIEW COMPARISON:  05/17/2008 chest radiograph FINDINGS: Stable cardiomediastinal silhouette with normal heart size. No pneumothorax. No pleural effusion. Mild left basilar atelectasis. Otherwise clear lungs. No displaced fracture. IMPRESSION: Mild left basilar atelectasis. No pneumothorax. No  displaced fracture in the visualized chest. Electronically Signed   By: Delbert Phenix M.D.   On: 03/08/2015 14:44     Visual Acuity Review  Right Eye Distance:   Left Eye Distance:   Bilateral Distance:    Right Eye Near:   Left Eye Near:    Bilateral Near:         MDM   1. Rib pain on left side    No fracture identified by radiology Advised patient she should treat symptoms with cold compresses,  tylenol. Requested stronger pain killer, but patient is pregnant.   Activity as tolerated.     Tharon Aquas, PA 03/08/15 [phone]

## 2015-03-08 NOTE — Discharge Instructions (Signed)
Rib Fracture °A rib fracture is a break or crack in one of the bones of the ribs. The ribs are like a cage that goes around your upper chest. A broken or cracked rib is often painful, but most do not cause other problems. Most rib fractures heal on their own in 1-3 months. °HOME CARE °· Avoid activities that cause pain to the injured area. Protect your injured area. °· Slowly increase activity as told by your doctor. °· Take medicine as told by your doctor. °· Put ice on the injured area for the first 1-2 days after you have been treated or as told by your doctor. °¨ Put ice in a plastic bag. °¨ Place a towel between your skin and the bag. °¨ Leave the ice on for 15-20 minutes at a time, every 2 hours while you are awake. °· Do deep breathing as told by your doctor. You may be told to: °¨ Take deep breaths many times a day. °¨ Cough many times a day while hugging a pillow. °¨ Use a device (incentive spirometer) to perform deep breathing many times a day. °· Drink enough fluids to keep your pee (urine) clear or pale yellow.   °· Do not wear a rib belt or binder. These do not allow you to breathe deeply. °GET HELP RIGHT AWAY IF:  °· You have a fever. °· You have trouble breathing.   °· You cannot stop coughing. °· You cough up thick or bloody spit (mucus).   °· You feel sick to your stomach (nauseous), throw up (vomit), or have belly (abdominal) pain.   °· Your pain gets worse and medicine does not help.   °MAKE SURE YOU:  °· Understand these instructions. °· Will watch your condition. °· Will get help right away if you are not doing well or get worse. °  °This information is not intended to replace advice given to you by your health care provider. Make sure you discuss any questions you have with your health care provider. °  °Document Released: 01/26/2008 Document Revised: 08/13/2012 Document Reviewed: 06/20/2012 °Elsevier Interactive Patient Education ©2016 [employer] ° °

## 2015-03-08 NOTE — ED Notes (Signed)
[facility] a [facility]. She stated that she "fell to the floor with some help." 3 days ago.

## 2015-05-03 NOTE — L&D Delivery Note (Signed)
Patient was C/C/+5 and pushed for 1 minutes with epidural.   NSVD  female infant, Apgars 9,9, weight P.   The patient had no lacerations. Fundus was firm. EBL was expected amount. Placenta was delivered intact. Vagina was clear.  Baby was vigorous and doing skin to skin with mother.  Jamie Glenn A

## 2015-06-26 ENCOUNTER — Encounter (HOSPITAL_COMMUNITY): Payer: Self-pay

## 2015-06-26 ENCOUNTER — Inpatient Hospital Stay (HOSPITAL_COMMUNITY)
Admission: AD | Admit: 2015-06-26 | Discharge: 2015-06-28 | DRG: 775 | Disposition: A | Discharge status: Home / Self Care | Payer: Medicaid Other | Source: Ambulatory Visit | Attending: Obstetrics and Gynecology | Admitting: Obstetrics and Gynecology

## 2015-06-26 ENCOUNTER — Inpatient Hospital Stay (HOSPITAL_COMMUNITY): Payer: Medicaid Other | Admitting: Anesthesiology

## 2015-06-26 DIAGNOSIS — Z3A40 40 weeks gestation of pregnancy: Secondary | ICD-10-CM | POA: Diagnosis not present

## 2015-06-26 DIAGNOSIS — O99824 Streptococcus B carrier state complicating childbirth: Secondary | ICD-10-CM | POA: Diagnosis present

## 2015-06-26 DIAGNOSIS — IMO0001 Reserved for inherently not codable concepts without codable children: Secondary | ICD-10-CM

## 2015-06-26 LAB — TYPE AND SCREEN
ABO/RH(D): O POS
ANTIBODY SCREEN: NEGATIVE

## 2015-06-26 LAB — CBC
HCT: 37.7 % (ref 36.0–46.0)
HEMOGLOBIN: 12.9 g/dL (ref 12.0–15.0)
MCH: 30 pg (ref 26.0–34.0)
MCHC: 34.2 g/dL (ref 30.0–36.0)
MCV: 87.7 fL (ref 78.0–100.0)
PLATELETS: 191 10*3/uL (ref 150–400)
RBC: 4.3 MIL/uL (ref 3.87–5.11)
RDW: 14 % (ref 11.5–15.5)
WBC: 13.3 10*3/uL — ABNORMAL HIGH (ref 4.0–10.5)

## 2015-06-26 MED ORDER — ACETAMINOPHEN 325 MG PO TABS
650.0000 mg | ORAL_TABLET | ORAL | Status: DC | PRN
  Administered 2015-06-27 – 2015-06-28 (×3): 650 mg via ORAL
  Filled 2015-06-26 (×3): qty 2

## 2015-06-26 MED ORDER — OXYCODONE-ACETAMINOPHEN 5-325 MG PO TABS: 2.0000 | ORAL_TABLET | ORAL | Status: DC | PRN

## 2015-06-26 MED ORDER — OXYTOCIN BOLUS FROM INFUSION
500.0000 mL | INTRAVENOUS | Status: DC
  Administered 2015-06-26: 500 mL via INTRAVENOUS

## 2015-06-26 MED ORDER — ZOLPIDEM TARTRATE 5 MG PO TABS: 5.0000 mg | ORAL_TABLET | Freq: Every evening | ORAL | Status: DC | PRN

## 2015-06-26 MED ORDER — MEASLES, MUMPS & RUBELLA VAC ~~LOC~~ INJ: 0.5000 mL | INJECTION | Freq: Once | SUBCUTANEOUS | Status: DC

## 2015-06-26 MED ORDER — MAGNESIUM HYDROXIDE 400 MG/5ML PO SUSP
30.0000 mL | ORAL | Status: DC | PRN
Start: 2015-06-26 — End: 2015-06-28

## 2015-06-26 MED ORDER — OXYCODONE-ACETAMINOPHEN 5-325 MG PO TABS
1.0000 | ORAL_TABLET | ORAL | Status: DC | PRN
Start: 2015-06-26 — End: 2015-06-26

## 2015-06-26 MED ORDER — LIDOCAINE HCL (PF) 1 % IJ SOLN
INTRAMUSCULAR | Status: DC | PRN
  Administered 2015-06-26 (×2): 4 mL

## 2015-06-26 MED ORDER — FLEET ENEMA 7-19 GM/118ML RE ENEM: 1.0000 | ENEMA | RECTAL | Status: DC | PRN

## 2015-06-26 MED ORDER — SODIUM CHLORIDE 0.9 % IV SOLN: 250.0000 mL | INTRAVENOUS | Status: DC | PRN

## 2015-06-26 MED ORDER — ONDANSETRON HCL 4 MG/2ML IJ SOLN: 4.0000 mg | INTRAMUSCULAR | Status: DC | PRN

## 2015-06-26 MED ORDER — BUTORPHANOL TARTRATE 1 MG/ML IJ SOLN
1.0000 mg | INTRAMUSCULAR | Status: DC | PRN
  Filled 2015-06-26: qty 1

## 2015-06-26 MED ORDER — IBUPROFEN 800 MG PO TABS
800.0000 mg | ORAL_TABLET | Freq: Three times a day (TID) | ORAL | Status: DC
  Administered 2015-06-26 – 2015-06-28 (×5): 800 mg via ORAL
  Filled 2015-06-26 (×5): qty 1

## 2015-06-26 MED ORDER — LANOLIN HYDROUS EX OINT: TOPICAL_OINTMENT | CUTANEOUS | Status: DC | PRN

## 2015-06-26 MED ORDER — LACTATED RINGERS IV SOLN: 500.0000 mL | Freq: Once | INTRAVENOUS | Status: DC

## 2015-06-26 MED ORDER — SODIUM CHLORIDE 0.9% FLUSH: 3.0000 mL | INTRAVENOUS | Status: DC | PRN

## 2015-06-26 MED ORDER — DIPHENHYDRAMINE HCL 50 MG/ML IJ SOLN
12.5000 mg | INTRAMUSCULAR | Status: DC | PRN
Start: 2015-06-26 — End: 2015-06-26

## 2015-06-26 MED ORDER — LIDOCAINE HCL (PF) 1 % IJ SOLN
30.0000 mL | INTRAMUSCULAR | Status: DC | PRN
  Filled 2015-06-26: qty 30

## 2015-06-26 MED ORDER — FERROUS SULFATE 325 (65 FE) MG PO TABS
325.0000 mg | ORAL_TABLET | Freq: Two times a day (BID) | ORAL | Status: DC
  Administered 2015-06-27 – 2015-06-28 (×3): 325 mg via ORAL
  Filled 2015-06-26 (×3): qty 1

## 2015-06-26 MED ORDER — WITCH HAZEL-GLYCERIN EX PADS: 1.0000 "application " | MEDICATED_PAD | CUTANEOUS | Status: DC | PRN

## 2015-06-26 MED ORDER — EPHEDRINE 5 MG/ML INJ
10.0000 mg | INTRAVENOUS | Status: DC | PRN
  Filled 2015-06-26: qty 2

## 2015-06-26 MED ORDER — LACTATED RINGERS IV SOLN
2.5000 [IU]/h | INTRAVENOUS | Status: DC
  Filled 2015-06-26: qty 4

## 2015-06-26 MED ORDER — DIPHENHYDRAMINE HCL 25 MG PO CAPS
25.0000 mg | ORAL_CAPSULE | Freq: Four times a day (QID) | ORAL | Status: DC | PRN
  Administered 2015-06-27 (×2): 25 mg via ORAL
  Filled 2015-06-26 (×2): qty 1

## 2015-06-26 MED ORDER — PHENYLEPHRINE 40 MCG/ML (10ML) SYRINGE FOR IV PUSH (FOR BLOOD PRESSURE SUPPORT)
80.0000 ug | PREFILLED_SYRINGE | INTRAVENOUS | Status: DC | PRN
  Filled 2015-06-26: qty 2
  Filled 2015-06-26: qty 20

## 2015-06-26 MED ORDER — PRENATAL MULTIVITAMIN CH
1.0000 | ORAL_TABLET | Freq: Every day | ORAL | Status: DC
  Administered 2015-06-27 – 2015-06-28 (×2): 1 via ORAL
  Filled 2015-06-26 (×2): qty 1

## 2015-06-26 MED ORDER — BENZOCAINE-MENTHOL 20-0.5 % EX AERO
1.0000 "application " | INHALATION_SPRAY | CUTANEOUS | Status: DC | PRN
  Administered 2015-06-26: 1 via TOPICAL
  Filled 2015-06-26: qty 56

## 2015-06-26 MED ORDER — DIBUCAINE 1 % RE OINT: 1.0000 "application " | TOPICAL_OINTMENT | RECTAL | Status: DC | PRN

## 2015-06-26 MED ORDER — ACETAMINOPHEN 325 MG PO TABS: 650.0000 mg | ORAL_TABLET | ORAL | Status: DC | PRN

## 2015-06-26 MED ORDER — TETANUS-DIPHTH-ACELL PERTUSSIS 5-2.5-18.5 LF-MCG/0.5 IM SUSP: 0.5000 mL | Freq: Once | INTRAMUSCULAR | Status: DC

## 2015-06-26 MED ORDER — ONDANSETRON HCL 4 MG PO TABS: 4.0000 mg | ORAL_TABLET | ORAL | Status: DC | PRN

## 2015-06-26 MED ORDER — SIMETHICONE 80 MG PO CHEW: 80.0000 mg | CHEWABLE_TABLET | ORAL | Status: DC | PRN

## 2015-06-26 MED ORDER — SENNOSIDES-DOCUSATE SODIUM 8.6-50 MG PO TABS
2.0000 | ORAL_TABLET | ORAL | Status: DC
  Administered 2015-06-27: 2 via ORAL
  Filled 2015-06-26: qty 2

## 2015-06-26 MED ORDER — SODIUM CHLORIDE 0.9% FLUSH: 3.0000 mL | Freq: Two times a day (BID) | INTRAVENOUS | Status: DC

## 2015-06-26 MED ORDER — PHENYLEPHRINE 40 MCG/ML (10ML) SYRINGE FOR IV PUSH (FOR BLOOD PRESSURE SUPPORT)
80.0000 ug | PREFILLED_SYRINGE | INTRAVENOUS | Status: DC | PRN
  Filled 2015-06-26: qty 2

## 2015-06-26 MED ORDER — IBUPROFEN 800 MG PO TABS
800.0000 mg | ORAL_TABLET | Freq: Once | ORAL | Status: DC
  Filled 2015-06-26: qty 1

## 2015-06-26 MED ORDER — FENTANYL 2.5 MCG/ML BUPIVACAINE 1/10 % EPIDURAL INFUSION (WH - ANES)
14.0000 mL/h | INTRAMUSCULAR | Status: DC | PRN
  Administered 2015-06-26: 14 mL/h via EPIDURAL
  Filled 2015-06-26: qty 125

## 2015-06-26 MED ORDER — ONDANSETRON HCL 4 MG/2ML IJ SOLN: 4.0000 mg | Freq: Four times a day (QID) | INTRAMUSCULAR | Status: DC | PRN

## 2015-06-26 MED ORDER — INFLUENZA VAC SPLIT QUAD 0.5 ML IM SUSY
0.5000 mL | PREFILLED_SYRINGE | INTRAMUSCULAR | Status: AC
  Administered 2015-06-27: 0.5 mL via INTRAMUSCULAR
  Filled 2015-06-26: qty 0.5

## 2015-06-26 MED ORDER — PENICILLIN G POTASSIUM 5000000 UNITS IJ SOLR
2.5000 10*6.[IU] | INTRAVENOUS | Status: DC
  Filled 2015-06-26 (×3): qty 2.5

## 2015-06-26 MED ORDER — LACTATED RINGERS IV SOLN
INTRAVENOUS | Status: DC
  Administered 2015-06-26: 15:00:00 via INTRAVENOUS

## 2015-06-26 MED ORDER — METHYLERGONOVINE MALEATE 0.2 MG PO TABS: 0.2000 mg | ORAL_TABLET | ORAL | Status: DC | PRN

## 2015-06-26 MED ORDER — DEXTROSE 5 % IV SOLN
5.0000 10*6.[IU] | Freq: Once | INTRAVENOUS | Status: AC
  Administered 2015-06-26: 5 10*6.[IU] via INTRAVENOUS
  Filled 2015-06-26: qty 5

## 2015-06-26 MED ORDER — LACTATED RINGERS IV SOLN: 500.0000 mL | INTRAVENOUS | Status: DC | PRN

## 2015-06-26 MED ORDER — CITRIC ACID-SODIUM CITRATE 334-500 MG/5ML PO SOLN: 30.0000 mL | ORAL | Status: DC | PRN

## 2015-06-26 MED ORDER — METHYLERGONOVINE MALEATE 0.2 MG/ML IJ SOLN: 0.2000 mg | INTRAMUSCULAR | Status: DC | PRN

## 2015-06-26 NOTE — MAU Note (Signed)
Contractions since 1152 every 3 to 5 minutes, denies vaginal bleeding, no LOF

## 2015-06-26 NOTE — Anesthesia Preprocedure Evaluation (Signed)
Anesthesia Evaluation  Patient identified by MRN, date of birth, ID band Patient awake    Reviewed: Allergy & Precautions, NPO status , Patient's Chart, lab work & pertinent test results  History of Anesthesia Complications Negative for: history of anesthetic complications  Airway Mallampati: II  TM Distance: >3 FB Neck ROM: Full    Dental no notable dental hx. (+) Dental Advisory Given   Pulmonary neg pulmonary ROS,    Pulmonary exam normal breath sounds clear to auscultation       Cardiovascular negative cardio ROS Normal cardiovascular exam Rhythm:Regular Rate:Normal     Neuro/Psych negative neurological ROS  negative psych ROS   GI/Hepatic negative GI ROS, Neg liver ROS,   Endo/Other  negative endocrine ROS  Renal/GU negative Renal ROS  negative genitourinary   Musculoskeletal negative musculoskeletal ROS (+)   Abdominal   Peds negative pediatric ROS (+)  Hematology negative hematology ROS (+)   Anesthesia Other Findings   Reproductive/Obstetrics (+) Pregnancy                             Anesthesia Physical Anesthesia Plan  ASA: II  Anesthesia Plan: Epidural   Post-op Pain Management:    Induction: Intravenous  Airway Management Planned:   Additional Equipment:   Intra-op Plan:   Post-operative Plan:   Informed Consent: I have reviewed the patients History and Physical, chart, labs and discussed the procedure including the risks, benefits and alternatives for the proposed anesthesia with the patient or authorized representative who has indicated his/her understanding and acceptance.   Dental advisory given  Plan Discussed with: CRNA  Anesthesia Plan Comments:         Anesthesia Quick Evaluation  

## 2015-06-26 NOTE — H&P (Signed)
30 y.o. [redacted]w[redacted]d  G4P2002 comes in c/o labor.  Otherwise has good fetal movement and no bleeding.  Past Medical History  Diagnosis Date  . H/O trichomoniasis 20o8    Past Surgical History  Procedure Laterality Date  . Wisdom tooth extraction      OB History  Gravida Para Term Preterm AB SAB TAB Ectopic Multiple Living  # Outcome Date GA Lbr Len/2nd Weight Sex Delivery Anes PTL Lv  4 Current           3 Term 01/24/14 [redacted]w[redacted]d 03:30 / 00:23 3.609 kg (7 lb 15.3 oz) M Vag-Spont EPI  Y  2 Gravida           1 Term     M Vag-Spont  N Y      Social History   Social History  . Marital Status: Single    Spouse Name: N/A  . Number of Children: N/A  . Years of Education: N/A   Occupational History  . Not on file.   Social History Main Topics  . Smoking status: Never Smoker   . Smokeless tobacco: Never Used  . Alcohol Use: Yes     Comment: occ  . Drug Use: No  . Sexual Activity: Yes   Other Topics Concern  . Not on file   Social History Narrative   Review of patient's allergies indicates no known allergies.    Prenatal Transfer Tool  Maternal Diabetes: No Genetic Screening: Abnormal:  Results: Elevated risk of Trisomy 72- but NIPT low risk female Maternal Ultrasounds/Referrals: Normal Fetal Ultrasounds or other Referrals:  None Maternal Substance Abuse:  No Significant Maternal Medications:  None Significant Maternal Lab Results: None  Other PNC: uncomplicated.    Filed Vitals:   06/26/15 1416 06/26/15 1432  BP: 123/81 114/81  Pulse: 77 80  Temp: 98.1 F (36.7 C)   Resp: 18 18     Lungs/Cor:  NAD Abdomen:  soft, gravid Ex:  no cords, erythema SVE:  4-5/80/-1 per nurse FHTs:  150, good STV, NST R Toco:  q  3   A/P   Term labor.  GBS POS= PCN.  Uri Covey A

## 2015-06-26 NOTE — MAU Note (Signed)
Urine in lab 

## 2015-06-26 NOTE — Discharge Summary (Signed)
Obstetric Discharge Summary Reason for Admission: onset of labor Prenatal Procedures: none Intrapartum Procedures: spontaneous vaginal delivery Postpartum Procedures: none Complications-Operative and Postpartum: none HEMOGLOBIN  Date Value Ref Range Status  06/26/2015 12.9 12.0 - 15.0 g/dL Final   HCT  Date Value Ref Range Status  06/26/2015 37.7 36.0 - 46.0 % Final    Discharge Diagnoses: Term Pregnancy-delivered  Discharge Information: Date: 06/26/2015 Activity: pelvic rest Diet: routine Medications: Ibuprofen Condition: stable Instructions: refer to practice specific booklet Discharge to: home Follow-up Information    Follow up with Harryette Shuart A, MD In 4 weeks.   Specialty:  Obstetrics and Gynecology   Contact information:   [address] [place] [address] [phone]       Newborn Data: Live born female  Birth Weight:   APGAR: ,   Home with mother.  Jamie Glenn A 06/26/2015, 7:11 PM

## 2015-06-26 NOTE — Progress Notes (Signed)
Pt is current smoker and refused nicotine patch that I offerred to call MD to get. Wants to go outside and off campus to smoke. Cautioned mother not to go outside and off campus as something could happen to injure her. Asked that she not get up to go out prior to ambulating with RN to bathroom.

## 2015-06-26 NOTE — Progress Notes (Signed)
Patient up to bathroom, then transferred by [facility] to room 169

## 2015-06-26 NOTE — Anesthesia Procedure Notes (Signed)
Epidural Patient location during procedure: OB  Staffing Anesthesiologist: Terence Googe Performed by: anesthesiologist   Preanesthetic Checklist Completed: patient identified, site marked, surgical consent, pre-op evaluation, timeout performed, IV checked, risks and benefits discussed and monitors and equipment checked  Epidural Patient position: sitting Prep: site prepped and draped and DuraPrep Patient monitoring: continuous pulse ox and blood pressure Approach: midline Location: L3-L4 Injection technique: LOR saline  Needle:  Needle type: Tuohy  Needle gauge: 17 G Needle length: 9 cm and 9 Needle insertion depth: 6 cm Catheter type: closed end flexible Catheter size: 19 Gauge Catheter at skin depth: 10 cm Test dose: negative  Assessment Events: blood not aspirated, injection not painful, no injection resistance, negative IV test and no paresthesia  Additional Notes Patient identified. Risks/Benefits/Options discussed with patient including but not limited to bleeding, infection, nerve damage, paralysis, failed block, incomplete pain control, headache, blood pressure changes, nausea, vomiting, reactions to medication both or allergic, itching and postpartum back pain. Confirmed with bedside nurse the patient's most recent platelet count. Confirmed with patient that they are not currently taking any anticoagulation, have any bleeding history or any family history of bleeding disorders. Patient expressed understanding and wished to proceed. All questions were answered. Sterile technique was used throughout the entire procedure. Please see nursing notes for vital signs. Test dose was given through epidural catheter and negative prior to continuing to dose epidural or start infusion. Warning signs of high block given to the patient including shortness of breath, tingling/numbness in hands, complete motor block, or any concerning symptoms with instructions to call for help. Patient was  given instructions on fall risk and not to get out of bed. All questions and concerns addressed with instructions to call with any issues or inadequate analgesia.    

## 2015-06-27 LAB — CBC
HCT: 33.1 % — ABNORMAL LOW (ref 36.0–46.0)
HEMOGLOBIN: 11.3 g/dL — AB (ref 12.0–15.0)
MCH: 29.9 pg (ref 26.0–34.0)
MCHC: 34.1 g/dL (ref 30.0–36.0)
MCV: 87.6 fL (ref 78.0–100.0)
Platelets: 173 10*3/uL (ref 150–400)
RBC: 3.78 MIL/uL — AB (ref 3.87–5.11)
RDW: 13.9 % (ref 11.5–15.5)
WBC: 13.7 10*3/uL — ABNORMAL HIGH (ref 4.0–10.5)

## 2015-06-27 LAB — RPR: RPR: NONREACTIVE

## 2015-06-27 MED ORDER — OXYCODONE HCL 5 MG PO TABS
5.0000 mg | ORAL_TABLET | ORAL | Status: AC
Start: 2015-06-27 — End: 2015-06-27
  Administered 2015-06-27: 5 mg via ORAL
  Filled 2015-06-27: qty 1

## 2015-06-27 MED ORDER — OXYCODONE-ACETAMINOPHEN 5-325 MG PO TABS
2.0000 | ORAL_TABLET | Freq: Four times a day (QID) | ORAL | Status: DC | PRN
  Administered 2015-06-27: 2 via ORAL
  Filled 2015-06-27: qty 2

## 2015-06-27 NOTE — Progress Notes (Signed)
Called to room  to see pad, pt in BR, multiple sm to med clots noted on pad, a few in toilet, no active trickling noted while pt on toilet.  Pt checked on return to bed, fundus firm, 2 below, no trickling or additional clots noted with fundal massage.

## 2015-06-27 NOTE — Anesthesia Postprocedure Evaluation (Signed)
Anesthesia Post Note  Patient: Jamie Glenn  Procedure(s) Performed: * No procedures listed *  Patient location during evaluation: Mother Baby Anesthesia Type: Epidural Level of consciousness: awake and alert and oriented Pain management: satisfactory to patient Vital Signs Assessment: post-procedure vital signs reviewed and stable Respiratory status: spontaneous breathing and nonlabored ventilation Cardiovascular status: stable Postop Assessment: no headache, no backache, no signs of nausea or vomiting, adequate PO intake and patient able to bend at knees (patient up walking) Anesthetic complications: no    Last Vitals:  Filed Vitals:   06/27/15 0230 06/27/15 0537  BP: 114/74 99/56  Pulse: 75 69  Temp: 36.6 C 36.6 C  Resp: 16 16    Last Pain:  Filed Vitals:   06/27/15 0657  PainSc: 6                  Ashyr Hedgepath

## 2015-06-27 NOTE — Progress Notes (Signed)
Acknowledged order for social work consult to assess mother's hx of Depression.  Introduced self and mother immediately stated "you came to see me about depression - I told them I didn't want to see you - I don't need the consult".  MOB states that she is doing fine and didn't see a need for the social work consult.  Informed her of CSW availability if needed in the future.  

## 2015-06-27 NOTE — Lactation Note (Signed)
This note was copied from a baby's chart. Lactation Consultation Note  Initial visit made.  Breastfeeding consultation services and support information given to patient.  Mom is experienced breastfeeding her previous babies.  She states newborn is feeding well.  Reminded to use good breast massage during feeding.  Encouraged to call out with concerns/assist prn.  Patient Name: Jamie Glenn [id number] Date: 06/27/2015 Reason for consult: Initial assessment;Infant < 6lbs   Maternal Data    Feeding Feeding Type: Breast Fed Length of feed: 20 min  LATCH Score/Interventions                      Lactation Tools Discussed/Used     Consult Status Consult Status: Follow-up Date: 06/28/15 Follow-up type: In-patient    Huston Foley 06/27/2015, 1:11 PM

## 2015-06-27 NOTE — Progress Notes (Signed)
Patient is eating, ambulating, voiding.  Pain control is good.  Filed Vitals:   06/26/15 2056 06/26/15 2216 06/27/15 0230 06/27/15 0537  BP: 105/60 119/65 114/74 99/56  Pulse: 72 80 75 69  Temp: 98.6 F (37 C) 98.3 F (36.8 C) 97.9 F (36.6 C) 97.9 F (36.6 C)  TempSrc: Oral Oral Axillary Oral  Resp: Height:      Weight:      SpO2:   99%     Fundus firm Perineum without swelling.  Lab Results  Component Value Date   WBC 13.7* 06/27/2015   HGB 11.3* 06/27/2015   HCT 33.1* 06/27/2015   MCV 87.6 06/27/2015   PLT 173 06/27/2015    --/--/O POS (02/24 1525)/RI  A/P Post partum day 1.  Routine care.  Expect d/c routine.    Sherrill Buikema A

## 2015-06-28 NOTE — Addendum Note (Signed)
Addendum  created 06/28/15 1546 by Graciela Husbands, CRNA   Modules edited: Charges VN, Notes Section   Notes Section:  File: [id number]

## 2015-06-28 NOTE — Anesthesia Postprocedure Evaluation (Signed)
Anesthesia Post Note  Patient: Jamie Glenn  Procedure(s) Performed: * No procedures listed *  Patient location during evaluation: Mother Baby Anesthesia Type: Epidural Level of consciousness: awake and alert Pain management: satisfactory to patient Vital Signs Assessment: post-procedure vital signs reviewed and stable Respiratory status: respiratory function stable Cardiovascular status: stable Postop Assessment: no headache, no backache, epidural receding, patient able to bend at knees, no signs of nausea or vomiting and adequate PO intake Anesthetic complications: no Comments: Comfort level was assessed by AnesthesiaTeam and the patient was pleased with the care, interventions, and services provided by the Department of Anesthesia.    Last Vitals:  Filed Vitals:   06/27/15 1800 06/28/15 0538  BP: 116/75 116/67  Pulse: 72 80  Temp: 36.8 C 36.4 C  Resp: 19 18    Last Pain:  Filed Vitals:   06/28/15 1308  PainSc: 0-No pain                 Anja Neuzil

## 2015-06-28 NOTE — Progress Notes (Signed)
Patient is eating, ambulating, voiding.  Pain control is good.  Filed Vitals:   06/27/15 0537 06/27/15 0935 06/27/15 1800 06/28/15 0538  BP: 99/56 113/75 116/75 116/67  Pulse: 69 76 72 80  Temp: 97.9 F (36.6 C) 98.6 F (37 C) 98.2 F (36.8 C) 97.5 F (36.4 C)  TempSrc: Oral Oral  Oral  Resp: Height:      Weight:      SpO2:   100% 98%    Fundus firm Perineum without swelling.  Lab Results  Component Value Date   WBC 13.7* 06/27/2015   HGB 11.3* 06/27/2015   HCT 33.1* 06/27/2015   MCV 87.6 06/27/2015   PLT 173 06/27/2015    --/--/O POS (02/24 1525)/RI  A/P Post partum day 2.  Routine care.  Expect d/c today.    Carlita Whitcomb A

## 2015-06-28 NOTE — Lactation Note (Signed)
This note was copied from a baby's chart. Lactation Consultation Note  Follow up visit made prior to discharge.  Mom states breasts are filling.  She just pumped of transitional milk with manual pump.  Observed mom independently latch baby to breast.  Comfort gels given with instructions for nipple soreness.  Lactation outpatient services encouraged prn.  Patient Name: Jamie Glenn [id number] Date: 06/28/2015 Reason for consult: Follow-up assessment   Maternal Data    Feeding Feeding Type: Breast Fed Length of feed: 15 min  LATCH Score/Interventions Latch: Grasps breast easily, tongue down, lips flanged, rhythmical sucking.  Audible Swallowing: Spontaneous and intermittent  Type of Nipple: Everted at rest and after stimulation  Comfort (Breast/Nipple): Soft / non-tender     Hold (Positioning): No assistance needed to correctly position infant at breast.  LATCH Score: 10  Lactation Tools Discussed/Used     Consult Status Consult Status: Complete    Huston Foley 06/28/2015, 11:59 AM

## 2015-09-08 ENCOUNTER — Other Ambulatory Visit: Payer: Self-pay | Admitting: Obstetrics and Gynecology

## 2015-09-08 DIAGNOSIS — N63 Unspecified lump in unspecified breast: Secondary | ICD-10-CM

## 2015-09-17 ENCOUNTER — Ambulatory Visit
Admission: RE | Admit: 2015-09-17 | Discharge: 2015-09-17 | Disposition: A | Discharge status: Home / Self Care | Payer: Medicaid Other | Source: Ambulatory Visit | Attending: Obstetrics and Gynecology | Admitting: Obstetrics and Gynecology

## 2015-09-17 DIAGNOSIS — N63 Unspecified lump in unspecified breast: Secondary | ICD-10-CM

## 2016-12-13 ENCOUNTER — Encounter (HOSPITAL_COMMUNITY): Payer: Self-pay

## 2016-12-27 ENCOUNTER — Other Ambulatory Visit: Payer: Self-pay | Admitting: Obstetrics and Gynecology

## 2016-12-27 DIAGNOSIS — N63 Unspecified lump in unspecified breast: Secondary | ICD-10-CM

## 2016-12-27 DIAGNOSIS — N644 Mastodynia: Secondary | ICD-10-CM

## 2017-01-02 IMAGING — DX DG CHEST 2V
2 series · 2 of 2 positions shown · non-contrast
Comparison: 05/17/2008 chest radiograph

CLINICAL DATA: Left posterior rib pain after recent fall

EXAM:
CHEST  2 VIEW

[chest pa]
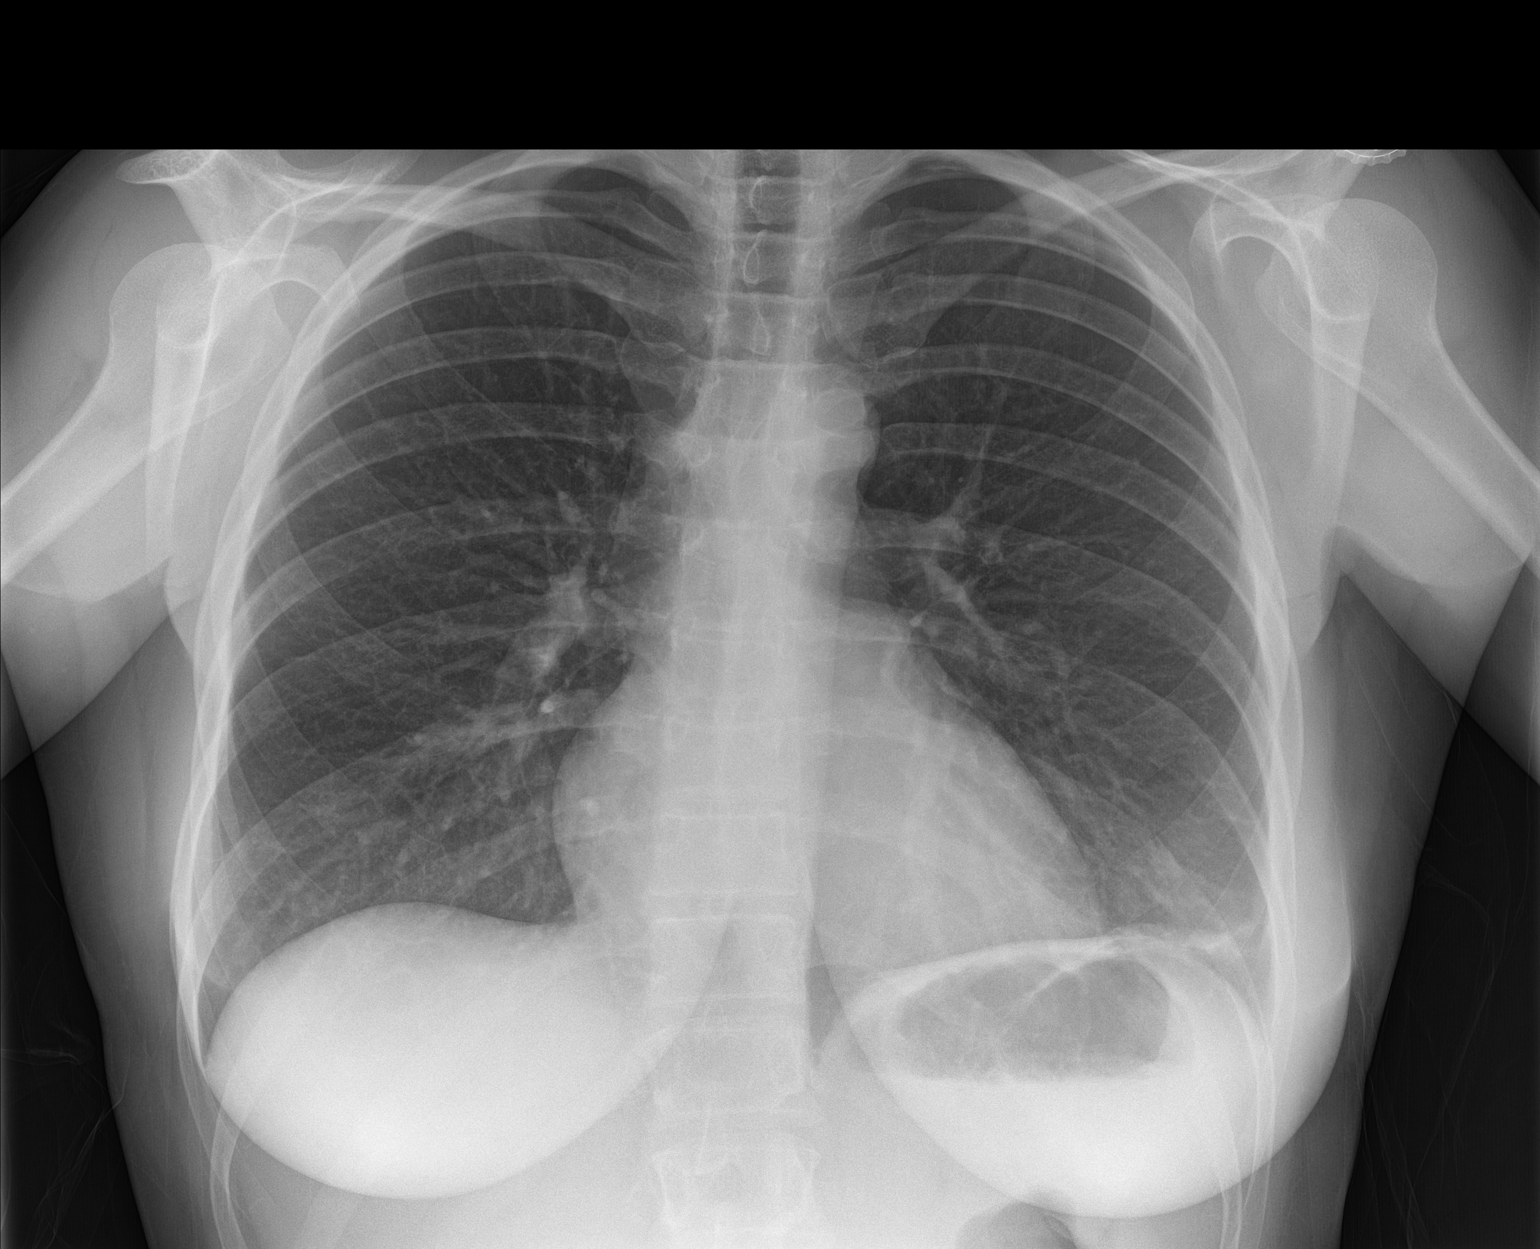

[chest lat]
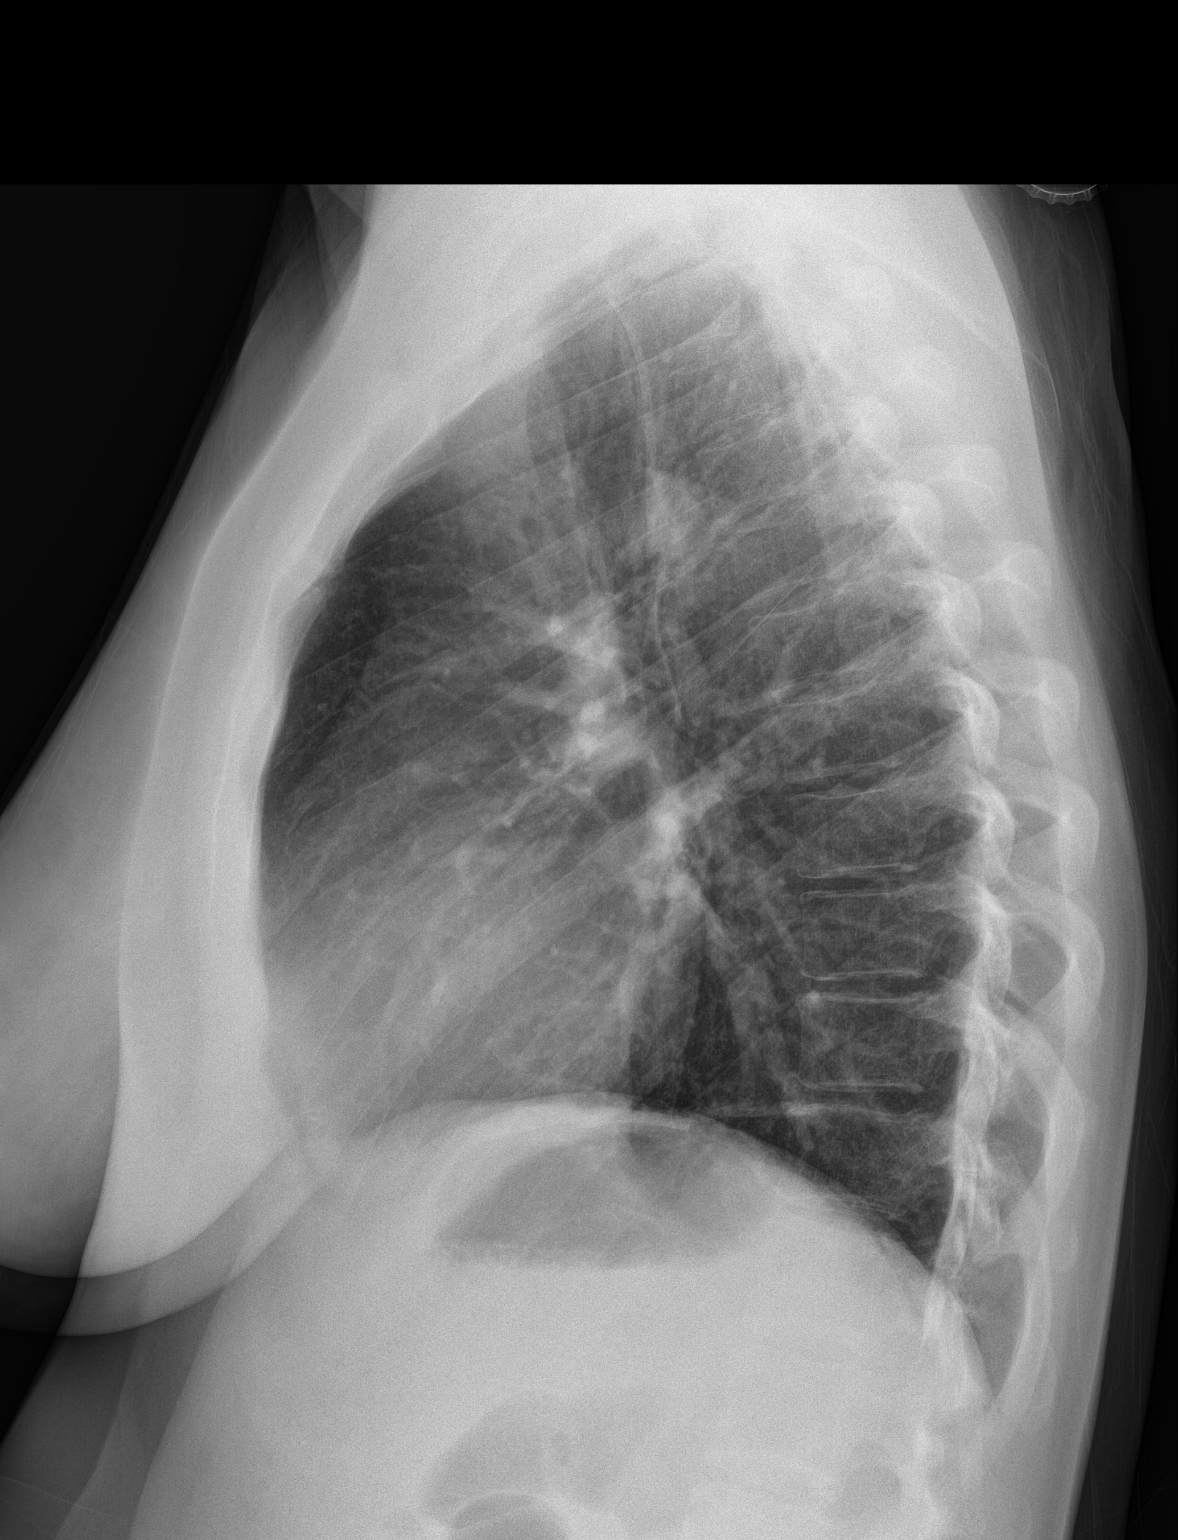

[2 of 2 positions shown; findings below may reference images not displayed]

FINDINGS: Stable cardiomediastinal silhouette with normal heart size. No
pneumothorax. No pleural effusion. Mild left basilar atelectasis.
Otherwise clear lungs. No displaced fracture.
IMPRESSION: Mild left basilar atelectasis. No pneumothorax. No displaced
fracture in the visualized chest.

## 2017-01-12 ENCOUNTER — Ambulatory Visit
Admission: RE | Admit: 2017-01-12 | Discharge: 2017-01-12 | Disposition: A | Discharge status: Home / Self Care | Payer: No Typology Code available for payment source | Source: Ambulatory Visit | Attending: Obstetrics and Gynecology | Admitting: Obstetrics and Gynecology

## 2017-01-12 ENCOUNTER — Encounter (HOSPITAL_COMMUNITY): Payer: Self-pay

## 2017-01-12 ENCOUNTER — Ambulatory Visit (HOSPITAL_COMMUNITY)
Admission: RE | Admit: 2017-01-12 | Discharge: 2017-01-12 | Disposition: A | Discharge status: Home / Self Care | Payer: Self-pay | Source: Ambulatory Visit | Attending: Obstetrics and Gynecology | Admitting: Obstetrics and Gynecology

## 2017-01-12 VITALS — BP 122/66 | HR 93 | Temp 98.8°F | Ht 63.0 in | Wt 134.4 lb

## 2017-01-12 DIAGNOSIS — Z1239 Encounter for other screening for malignant neoplasm of breast: Secondary | ICD-10-CM

## 2017-01-12 DIAGNOSIS — N63 Unspecified lump in unspecified breast: Secondary | ICD-10-CM

## 2017-01-12 DIAGNOSIS — N644 Mastodynia: Secondary | ICD-10-CM

## 2017-01-12 NOTE — Progress Notes (Signed)
[facility] due to recommending 9031-month right breast follow-up. Last Diagnostic mammogram and right breast ultrasound completed 09/17/2015. Patient complained of spontaneous milky right breast nipple discharge x 2 weeks.  Pap Smear: Pap smear not completed today. [facility] and normal. Per patient has a history of an abnormal Pap smear 10 years ago that a repeat Pap smear was completed for follow up that was normal. Patient stated she has had at least three normal Pap smears since abnormal. Last Pap smear result to be scanned into EPIC.  Physical exam: Breasts Breasts symmetrical. No skin abnormalities bilateral breasts. No nipple retraction bilateral breasts. No nipple discharge bilateral breasts. Unable to express any nipple discharge on exam. No lymphadenopathy. No lumps palpated bilateral breasts. No complaints of pain or tenderness on exam. Referred [facility] to [facility] for diagnostic mammogram and right breast ultrasound per recommendation. Appointment scheduled for Thursday, January 12, 2017 at 1400.        Pelvic/Bimanual No Pap smear completed today since last Pap smear was 12/13/2016. Pap smear not indicated per BCCCP guidelines.   Smoking History: Patient is a current smoker. Discussed smoking cessation with patient. Referred patient to the [facility] and gave resources to free smoking cessation classes offered at [facility].  Patient Navigation: Patient education provided. Access to services provided for patient through [facility].

## 2017-01-12 NOTE — Patient Instructions (Signed)
Explained breast self awareness with Jamie FassAlicia D Glenn. Patient did not need a Pap smear today due to last Pap smear was 12/13/2016. Let her know BCCCP will cover Pap smears every 3 years unless has a history of abnormal Pap smears. [facility] for diagnostic mammogram and right breast ultrasound per recommendation. Appointment scheduled for Thursday, January 12, 2017 at 1400. Jamie FassAlicia D Class verbalized understanding.  Jamie Glenn, Kathaleen Maserhristine Poll, RN 1:39 PM

## 2017-06-23 ENCOUNTER — Ambulatory Visit: Payer: Self-pay | Admitting: Emergency Medicine

## 2017-06-23 VITALS — BP 132/62 | HR 100 | Temp 98.4°F | Resp 18 | Wt 127.4 lb

## 2017-06-23 DIAGNOSIS — R059 Cough, unspecified: Secondary | ICD-10-CM

## 2017-06-23 DIAGNOSIS — R509 Fever, unspecified: Secondary | ICD-10-CM

## 2017-06-23 DIAGNOSIS — R6889 Other general symptoms and signs: Secondary | ICD-10-CM

## 2017-06-23 DIAGNOSIS — R05 Cough: Secondary | ICD-10-CM

## 2017-06-23 DIAGNOSIS — H6593 Unspecified nonsuppurative otitis media, bilateral: Secondary | ICD-10-CM

## 2017-06-23 LAB — POCT INFLUENZA A/B
INFLUENZA A, POC: NEGATIVE
Influenza B, POC: NEGATIVE

## 2017-06-23 LAB — POCT RAPID STREP A (OFFICE): RAPID STREP A SCREEN: NEGATIVE

## 2017-06-23 MED ORDER — AMOXICILLIN 875 MG PO TABS: 875.0000 mg | ORAL_TABLET | Freq: Two times a day (BID) | ORAL | 0 refills | Status: DC

## 2017-06-23 MED ORDER — BENZONATATE 100 MG PO CAPS: 100.0000 mg | ORAL_CAPSULE | Freq: Three times a day (TID) | ORAL | 0 refills | Status: DC | PRN

## 2017-06-23 NOTE — Patient Instructions (Addendum)
PLAN: Antibiotic- take as directed  Tessalon Pearls- take 1-2 capsules with full glass of water three times daily as needed Motrin- 600 mg every 8 hours with food or milk as directed Work note- if symptoms unimproved Cough, Adult A cough helps to clear your throat and lungs. A cough may last only 2-3 weeks (acute), or it may last longer than 8 weeks (chronic). Many different things can cause a cough. A cough may be a sign of an illness or another medical condition. Follow these instructions at home:  Pay attention to any changes in your cough.  Take medicines only as told by your doctor. ? If you were prescribed an antibiotic medicine, take it as told by your doctor. Do not stop taking it even if you start to feel better. ? Talk with your doctor before you try using a cough medicine.  Drink enough fluid to keep your pee (urine) clear or pale yellow.  If the air is dry, use a cold steam vaporizer or humidifier in your home.  Stay away from things that make you cough at work or at home.  If your cough is worse at night, try using extra pillows to raise your head up higher while you sleep.  Do not smoke, and try not to be around smoke. If you need help quitting, ask your doctor.  Do not have caffeine.  Do not drink alcohol.  Rest as needed. Contact a doctor if:  You have new problems (symptoms).  You cough up yellow fluid (pus).  Your cough does not get better after 2-3 weeks, or your cough gets worse.  Medicine does not help your cough and you are not sleeping well.  You have pain that gets worse or pain that is not helped with medicine.  You have a fever.  You are losing weight and you do not know why.  You have night sweats. Get help right away if:  You cough up blood.  You have trouble breathing.  Your heartbeat is very fast. This information is not intended to replace advice given to you by your health care provider. Make sure you discuss any questions you have  with your health care provider. Document Released: 12/30/2010 Document Revised: 09/24/2015 Document Reviewed: 06/25/2014 Elsevier Interactive Patient Education  2018 [employer]  Otitis Media, Adult Otitis media is redness, soreness, and puffiness (swelling) in the space just behind your eardrum (middle ear). It may be caused by allergies or infection. It often happens along with a cold. Follow these instructions at home:  Take your medicine as told. Finish it even if you start to feel better.  Only take over-the-counter or prescription medicines for pain, discomfort, or fever as told by your doctor.  Follow up with your doctor as told. Contact a doctor if:  You have otitis media only in one ear, or bleeding from your nose, or both.  You notice a lump on your neck.  You are not getting better in 3-5 days.  You feel worse instead of better. Get help right away if:  You have pain that is not helped with medicine.  You have puffiness, redness, or pain around your ear.  You get a stiff neck.  You cannot move part of your face (paralysis).  You notice that the bone behind your ear hurts when you touch it. This information is not intended to replace advice given to you by your health care provider. Make sure you discuss any questions you have with your health  care provider. Document Released: 10/05/2007 Document Revised: 09/24/2015 Document Reviewed: 11/13/2012 Elsevier Interactive Patient Education  2017 [employer].

## 2017-06-23 NOTE — Progress Notes (Signed)
Jamie Glenn, 32 y.o., female who present on day 2 of symptoms of fever, chills, fatigue and body aches. 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   Review of Systems  Constitutional: Positive for chills, diaphoresis, fever and malaise/fatigue. Negative for weight loss.  HENT: Positive for ear pain. Negative for congestion, ear discharge, hearing loss, nosebleeds, sinus pain, sore throat and tinnitus.   Eyes: Negative for blurred vision.  Respiratory: Negative for cough, hemoptysis, sputum production and shortness of breath.   Cardiovascular: Negative for chest pain, palpitations and orthopnea.  Gastrointestinal: Positive for nausea. Negative for constipation, diarrhea, heartburn and vomiting.  Genitourinary: Negative for dysuria.  Musculoskeletal: Positive for myalgias and neck pain. Negative for back pain and joint pain.  Skin: Negative for itching and rash.  Neurological: Positive for dizziness and weakness. Negative for tingling, tremors and headaches.  Endo/Heme/Allergies: Negative for environmental allergies and polydipsia. Does not bruise/bleed easily.  Psychiatric/Behavioral: Negative for depression and suicidal ideas.   O  Vitals:   06/23/17 1621  BP: 132/62  Pulse: 100  Resp: 18  Temp: 98.4 F (36.9 C)  SpO2: 100%     Physical Exam  Constitutional: She is oriented to person, place, and time. She appears well-developed and well-nourished.  HENT:  Head: Normocephalic.  Right Ear: Hearing, external ear and ear canal normal. A middle ear effusion is present.  Left Ear: Hearing, external ear and ear canal normal. A middle ear effusion is present.  Nose: Nose normal.  Mouth/Throat: Uvula is midline, oropharynx is clear and moist and mucous membranes are normal. Tonsils are 1+ on the right. Tonsils are 1+ on the left.  Eyes: Pupils are equal, round, and reactive to light.  Neck: Normal range of  motion. No JVD present. No tracheal deviation present. No thyromegaly present.  Cardiovascular: Regular rhythm. Tachycardia present.  Pulmonary/Chest: Effort normal and breath sounds normal.  Abdominal: Soft. Bowel sounds are normal. She exhibits no distension and no mass. There is no tenderness. There is no guarding.  Musculoskeletal: Normal range of motion.  Lymphadenopathy:    She has cervical adenopathy.       Left cervical: Superficial cervical adenopathy present.  Neurological: She is alert and oriented to person, place, and time.  Skin: Skin is warm and dry.  Psychiatric: She has a normal mood and affect. Her behavior is normal.    A:  1. Fever and chills   2. Bilateral serous otitis media, unspecified chronicity   3. Flu-like symptoms   4. Cough      P 1. Fever and chills - POCT rapid strep A - POCT Influenza A/B  2. Bilateral serous otitis media, unspecified chronicity - amoxicillin (AMOXIL) 875 MG tablet; Take 1 tablet (875 mg total) by mouth 2 (two) times daily.  Dispense: 20 tablet; Refill: 0  3. Flu-like symptoms - POCT rapid strep A - POCT Influenza A/B  4. Cough - benzonatate (TESSALON PERLES) 100 MG capsule; Take 1 capsule (100 mg total) by mouth 3 (three) times daily as needed.  Dispense: 30 capsule; Refill: 0 - POCT rapid strep A - POCT Influenza A/B  PLAN: Antibiotic- take as directed  Tessalon Pearls- take 1-2 capsules with full glass of water three times daily as needed Motrin- 600 mg every 8 hours with food or milk as directed Work note- if symptoms unimproved provided Discussed influenza negative test result- possibility of false negative and  risks and benefits of treatment versus non treatment including length of patient symptoms, age, risk factors and patient desire to purchase medications due to cost, efficacy and ability to resolve symptoms. Plan of care discussed with patient who agrees with POC, good Rx coupon provided and patient left with AVS  in NAD with work note.

## 2017-06-28 ENCOUNTER — Telehealth: Payer: Self-pay

## 2018-01-04 ENCOUNTER — Ambulatory Visit (HOSPITAL_COMMUNITY)
Admission: EM | Admit: 2018-01-04 | Discharge: 2018-01-04 | Disposition: A | Discharge status: Home / Self Care | Payer: Medicaid Other

## 2018-01-04 ENCOUNTER — Emergency Department (HOSPITAL_COMMUNITY)
Admission: EM | Admit: 2018-01-04 | Discharge: 2018-01-04 | Discharge status: Against Medical Advice | Payer: Medicaid Other

## 2018-01-04 NOTE — ED Triage Notes (Signed)
Pt c/o center back pain x4 days, states the back pain went down and now shes having pain in her R rib area. Pt also c/o soreness in her RLQ. Pt stated she was concerned about appendicitis, pt offered to stay here and see a provider but made aware of our capabilities. Pt decided to go to the ER.

## 2018-01-04 NOTE — ED Notes (Signed)
Pt stated that she did not want to be here all night. Stated that she called [facility] and they had an hour wait time. Consulting civil engineer notified.

## 2019-04-15 ENCOUNTER — Other Ambulatory Visit: Payer: Self-pay

## 2019-04-15 ENCOUNTER — Encounter: Payer: Self-pay | Admitting: Emergency Medicine

## 2019-04-15 ENCOUNTER — Ambulatory Visit
Admission: EM | Admit: 2019-04-15 | Discharge: 2019-04-15 | Disposition: A | Discharge status: Home / Self Care | Payer: Medicaid Other | Attending: Emergency Medicine | Admitting: Emergency Medicine

## 2019-04-15 DIAGNOSIS — Z3202 Encounter for pregnancy test, result negative: Secondary | ICD-10-CM

## 2019-04-15 DIAGNOSIS — R1031 Right lower quadrant pain: Secondary | ICD-10-CM | POA: Diagnosis not present

## 2019-04-15 LAB — POCT URINE PREGNANCY: Preg Test, Ur: NEGATIVE

## 2019-04-15 NOTE — Discharge Instructions (Addendum)
Important to schedule appointment with primary care to establish care. In the interim, keep log of your symptoms: When you are experiencing pain, what makes it worse/better and bring this for review at your appointment. Go to ER in the meantime for worsening pain, change in your bowel or bladder habit, fever, decreased appetite, vomiting.

## 2019-04-15 NOTE — ED Provider Notes (Signed)
[facility]    CSN: [id number] Arrival date & time: 04/15/19  1225      History   Chief Complaint Chief Complaint  Patient presents with  . Abdominal Pain    HPI Jamie Glenn is a 33 y.o. female presenting for 1 month course of intermittent right lower quadrant pain.  Patient denies known exacerbating, alleviating factors.  States sometimes it will radiate to her right low back.  Patient denies urinary symptoms, vaginal discharge, pelvic pain.  Does admit to painful episode of intercourse, though does not member when this was.  Unknown LMP, has IUD in place since 2017.  Denies change in bowel habit, appetite.  No fever or history of abdominal surgery.   Past Medical History:  Diagnosis Date  . H/O trichomoniasis 20o8    Patient Active Problem List   Diagnosis Date Noted  . Active labor 06/26/2015  . Post-dates pregnancy 01/24/2014  . Painful breasts 05/13/2013  . Smoker 10/25/2012    Past Surgical History:  Procedure Laterality Date  . WISDOM TOOTH EXTRACTION      OB History    Gravida  4   Para  3   Term  3   Preterm      AB      Living  3     SAB      TAB      Ectopic      Multiple  0   Live Births  3            Home Medications    Prior to Admission medications   Medication Sig Start Date End Date Taking? Authorizing Provider  acetaminophen (TYLENOL) 325 MG tablet Take 325 mg by mouth every 6 (six) hours as needed for mild pain.    [provider]  diphenhydrAMINE (BENADRYL) 25 MG tablet Take 25 mg by mouth at bedtime as needed for sleep.  04/15/19  [provider]    Family History Family History  Problem Relation Age of Onset  . Hypertension Mother   . Hypertension Father   . Diabetes Maternal Aunt   . Cancer Paternal Uncle   . Breast cancer Maternal Grandmother   . Cancer Maternal Grandmother        skin    Social History Social History   Tobacco Use  . Smoking status: Current Every Day  Smoker    Packs/day: 0.50    Years: 20.00    Pack years: 10.00    Types: Cigarettes  . Smokeless tobacco: Never Used  Substance Use Topics  . Alcohol use: Yes    Comment: 3 daily  . Drug use: No     Allergies   Patient has no known allergies.   Review of Systems Review of Systems  Constitutional: Negative for fatigue and fever.  HENT: Negative for ear pain, sinus pain, sore throat and voice change.   Eyes: Negative for pain, redness and visual disturbance.  Respiratory: Negative for cough and shortness of breath.   Cardiovascular: Negative for chest pain and palpitations.  Gastrointestinal: Positive for abdominal pain. Negative for abdominal distention, anal bleeding, blood in stool, constipation, diarrhea, nausea, rectal pain and vomiting.  Genitourinary: Negative for dysuria, flank pain, frequency, hematuria, pelvic pain, urgency, vaginal bleeding, vaginal discharge and vaginal pain.  Musculoskeletal: Negative for arthralgias and myalgias.  Skin: Negative for rash and wound.  Neurological: Negative for syncope and headaches.     Physical Exam Triage Vital Signs ED Triage Vitals  Enc  Vitals Group     BP 04/15/19 1234 128/82     Pulse Rate 04/15/19 1234 87     Resp 04/15/19 1234 18     Temp 04/15/19 1234 (!) 97.3 F (36.3 C)     Temp Source 04/15/19 1234 Temporal     SpO2 04/15/19 1234 98 %     Weight --      Height --      Head Circumference --      Peak Flow --      Pain Score 04/15/19 1236 0     Pain Loc --      Pain Edu? --      Excl. in GC? --    No data found.  Updated Vital Signs BP 128/82 (BP Location: Left Arm)   Pulse 87   Temp (!) 97.3 F (36.3 C) (Temporal)   Resp 18   SpO2 98%   Visual Acuity Right Eye Distance:   Left Eye Distance:   Bilateral Distance:    Right Eye Near:   Left Eye Near:    Bilateral Near:     Physical Exam Constitutional:      General: She is not in acute distress.    Appearance: She is well-developed and  normal weight. She is not ill-appearing.  HENT:     Head: Normocephalic and atraumatic.  Eyes:     General: No scleral icterus.    Pupils: Pupils are equal, round, and reactive to light.  Cardiovascular:     Rate and Rhythm: Normal rate.  Pulmonary:     Effort: Pulmonary effort is normal.  Abdominal:     General: Abdomen is flat. Bowel sounds are normal. There is no distension.     Palpations: Abdomen is soft. There is no hepatomegaly or splenomegaly.     Tenderness: There is no abdominal tenderness. There is no right CVA tenderness, left CVA tenderness, guarding or rebound. Negative signs include Murphy's sign, Rovsing's sign, McBurney's sign and psoas sign.     Hernia: A hernia is present. Hernia is present in the umbilical area.     Comments: Umbilical hernia <0.5 cm without prolapsing tissue  Genitourinary:    Vagina: Normal. No foreign body. No tenderness.     Cervix: No cervical motion tenderness or friability.     Uterus: Normal.      Adnexa:        Right: No mass or tenderness.         Left: No mass or tenderness.    Skin:    Coloration: Skin is not jaundiced or pale.  Neurological:     Mental Status: She is alert and oriented to person, place, and time.      UC Treatments / Results  Labs (all labs ordered are listed, but only abnormal results are displayed) Labs Reviewed  POCT URINE PREGNANCY - Normal  CERVICOVAGINAL ANCILLARY ONLY    EKG   Radiology No results found.  Procedures Procedures (including critical care time)  Medications Ordered in UC Medications - No data to display  Initial Impression / Assessment and Plan / UC Course  I have reviewed the triage vital signs and the nursing notes.  Pertinent labs & imaging results that were available during my care of the patient were reviewed by me and considered in my medical decision making (see chart for details).     Patient afebrile, nontoxic.  Now chronic issue without signs of acute abdomen.   Pelvic exam unremarkable: Cervical vaginal  swab pending.  POCT urine pregnancy done in office, reviewed by me: Negative.  Patient establish care with PCP, get OB/GYN, monitor symptoms, go to ER for worsening symptoms as outlined below in the interim.  Return precautions discussed, patient verbalized understanding and is agreeable to plan. Final Clinical Impressions(s) / UC Diagnoses   Final diagnoses:  RLQ abdominal pain     Discharge Instructions     Important to schedule appointment with primary care to establish care. In the interim, keep log of your symptoms: When you are experiencing pain, what makes it worse/better and bring this for review at your appointment. Go to ER in the meantime for worsening pain, change in your bowel or bladder habit, fever, decreased appetite, vomiting.    ED Prescriptions    None     PDMP not reviewed this encounter.   Hall[place]Potvin, [place], [place] 04/15/19 1340

## 2019-04-15 NOTE — ED Notes (Signed)
Patient able to ambulate independently  

## 2019-04-15 NOTE — ED Triage Notes (Addendum)
Pt presents to [facility] for assessment of RLQ pain x 1 month.  Patient states intercourse made it worse the other day.  Patient states she has some radiation to the right hip.  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

## 2019-04-17 LAB — CERVICOVAGINAL ANCILLARY ONLY
Bacterial vaginitis: POSITIVE — AB
Candida vaginitis: NEGATIVE
Chlamydia: NEGATIVE
Neisseria Gonorrhea: NEGATIVE
Trichomonas: NEGATIVE

## 2019-04-18 ENCOUNTER — Telehealth: Payer: Self-pay | Admitting: Emergency Medicine

## 2019-04-18 MED ORDER — METRONIDAZOLE 500 MG PO TABS: 500.0000 mg | ORAL_TABLET | Freq: Two times a day (BID) | ORAL | 0 refills | Status: AC

## 2019-04-18 NOTE — Telephone Encounter (Signed)
Bacterial vaginosis is positive. This was not treated at the urgent care visit.  Flagyl 500 mg BID x 7 days #14 no refills sent to patients pharmacy of choice.    Attempted to reach patient. No answer at this time. Mailbox full.   

## 2019-04-19 ENCOUNTER — Telehealth (HOSPITAL_COMMUNITY): Payer: Self-pay | Admitting: Emergency Medicine

## 2019-04-19 NOTE — Telephone Encounter (Signed)
Patient contacted by phone and made aware of   BV results. Pt verbalized understanding and had all questions answered.    

## 2019-06-10 ENCOUNTER — Other Ambulatory Visit: Payer: Self-pay | Admitting: Cardiology

## 2019-06-10 DIAGNOSIS — Z20822 Contact with and (suspected) exposure to covid-19: Secondary | ICD-10-CM

## 2019-06-11 ENCOUNTER — Other Ambulatory Visit: Payer: Self-pay

## 2019-06-11 LAB — NOVEL CORONAVIRUS, NAA: SARS-CoV-2, NAA: NOT DETECTED

## 2019-06-11 NOTE — Telephone Encounter (Signed)
Error

## 2019-10-01 ENCOUNTER — Ambulatory Visit: Payer: Medicaid Other | Attending: Internal Medicine

## 2019-10-01 ENCOUNTER — Other Ambulatory Visit: Payer: Self-pay

## 2019-10-01 DIAGNOSIS — Z20822 Contact with and (suspected) exposure to covid-19: Secondary | ICD-10-CM

## 2019-10-02 LAB — NOVEL CORONAVIRUS, NAA: SARS-CoV-2, NAA: NOT DETECTED

## 2019-10-02 LAB — SARS-COV-2, NAA 2 DAY TAT

## 2021-04-01 ENCOUNTER — Other Ambulatory Visit: Payer: Self-pay

## 2021-04-01 ENCOUNTER — Ambulatory Visit (INDEPENDENT_AMBULATORY_CARE_PROVIDER_SITE_OTHER): Payer: Medicaid Other | Admitting: Cardiovascular Disease

## 2021-04-01 ENCOUNTER — Encounter: Payer: Self-pay | Admitting: Cardiovascular Disease

## 2021-04-01 VITALS — BP 130/84 | HR 64 | Ht 63.0 in | Wt 128.0 lb

## 2021-04-01 DIAGNOSIS — R002 Palpitations: Secondary | ICD-10-CM | POA: Diagnosis not present

## 2021-04-01 DIAGNOSIS — R9431 Abnormal electrocardiogram [ECG] [EKG]: Secondary | ICD-10-CM

## 2021-04-01 NOTE — Progress Notes (Signed)
Cardiology Office Note:    Date:  04/03/2021   ID:  Jamie Glenn, DOB 12/22/1985, MRN [id number]  PCP:  Patient, No Pcp Per (Inactive)   [facility] Providers Cardiologist:  None     Referring MD: Sherral Hammers, FNP   Chief Complaint  Patient presents with   Abnormal ECG  Jamie Glenn is a 35 y.o. female who is being seen today for the evaluation of normal electrocardiogram at the request of Sherral Hammers, FNP.   History of Present Illness:    Jamie Glenn is a 35 y.o. female with a hx of generalized anxiety and ADD, bipolar disorder, insomnia who recently underwent a screening ECG prior to initiation of Vyvanse therapy.  The automatic interpretation from the ECG algorithm reported atrial flutter so she is referred in consultation.  The ECG performed on 10 04/20/2009 actually shows sinus rhythm.  There is reversal of the right and left upper limb leads.  An EKG performed in our office today is a completely normal tracing.  She often has palpitations.  She describes this as a sensation of her heart racing, but regular.  The heart rate increases gradually and the resolution is also gradual.  It occurs at rest.  She will have heart rates on her smart watch of 120-140 bpm.  Her watch does not have ECG capabilities.  Otherwise she has no cardiac complaints.The patient specifically denies any chest pain at rest exertion, dyspnea at rest or with exertion, orthopnea, paroxysmal nocturnal dyspnea, syncope, focal neurological deficits, intermittent claudication, lower extremity edema, unexplained weight gain, cough, hemoptysis or wheezing.   Past Medical History:  Diagnosis Date   H/O trichomoniasis 20o8    Past Surgical History:  Procedure Laterality Date   WISDOM TOOTH EXTRACTION      Current Medications: Current Meds  Medication Sig   acetaminophen (TYLENOL) 325 MG tablet Take 325 mg by mouth every 6 (six) hours as needed for mild pain.   clonazePAM (KLONOPIN) 0.5  MG tablet Take 0.5 mg by mouth as needed.   levonorgestrel (MIRENA, 52 MG,) 20 MCG/DAY IUD Mirena 20 mcg/24 hours (8 yrs) 52 mg intrauterine device  Take 1 device by intrauterine route.   lurasidone (LATUDA) 40 MG TABS tablet Take 40 mg by mouth daily.   VYVANSE 50 MG capsule Take 50 mg by mouth every morning.     Allergies:   Patient has no known allergies.   Social History   Socioeconomic History   Marital status: Single    Spouse name: Not on file   Number of children: Not on file   Years of education: Not on file   Highest education level: Not on file  Occupational History   Not on file  Tobacco Use   Smoking status: Every Day    Packs/day: 0.50    Years: 20.00    Pack years: 10.00    Types: Cigarettes   Smokeless tobacco: Never  Substance and Sexual Activity   Alcohol use: Yes    Comment: 3 daily   Drug use: No   Sexual activity: Yes    Birth control/protection: I.U.D.  Other Topics Concern   Not on file  Social History Narrative   Not on file   Social Determinants of Health   Financial Resource Strain: Not on file  Food Insecurity: Not on file  Transportation Needs: Not on file  Physical Activity: Not on file  Stress: Not on file  Social Connections: Not on file  Family History: The patient's family history includes Breast cancer in her maternal grandmother; Cancer in her maternal grandmother and paternal uncle; Diabetes in her maternal aunt; Hypertension in her father and mother.  ROS:   Please see the history of present illness.     All other systems reviewed and are negative.  EKGs/Labs/Other Studies Reviewed:    The following studies were reviewed today: ECG and notes from primary care provider 02/10/2021  EKG:  EKG is  ordered today.  The ekg ordered today demonstrates normal sinus rhythm, normal tracing.  The ECG from 02/10/2021 has limb lead reversal, but is otherwise a normal tracing.  Recent Labs: No results found for requested labs  within last 8760 hours.  Recent Lipid Panel    Component Value Date/Time   CHOL 211 (H) 10/25/2012 1644     Risk Assessment/Calculations:           Physical Exam:    VS:  BP 130/84 (BP Location: Left Arm, Patient Position: Sitting, Cuff Size: Normal)   Pulse 64   Ht 5\' 3"  (1.6 m)   Wt 128 lb (58.1 kg)   SpO2 99%   BMI 22.67 kg/m     Wt Readings from Last 3 Encounters:  04/01/21 128 lb (58.1 kg)  06/23/17 127 lb 6.4 oz (57.8 kg)  01/12/17 134 lb 6.4 oz (61 kg)     GEN: Thin, well nourished, well developed in no acute distress HEENT: Normal NECK: No JVD; No carotid bruits LYMPHATICS: No lymphadenopathy CARDIAC: RRR, no murmurs, rubs, gallops RESPIRATORY:  Clear to auscultation without rales, wheezing or rhonchi  ABDOMEN: Soft, non-tender, non-distended MUSCULOSKELETAL:  No edema; No deformity  SKIN: Warm and dry NEUROLOGIC:  Alert and oriented x 3 PSYCHIATRIC:  Normal affect.  Very anxious  ASSESSMENT:    1. Abnormal EKG   2. Palpitations    PLAN:    In order of problems listed above:  Her ECG is not abnormal.  Limb leads were reversed.  She does not have atrial flutter on the October tracing.  ECG is normal today. Palpitations: From her description, these very likely represent sinus tachycardia.  For her age, heart rates in the 120-140 range are quite physiological with physical exercise, emotional stress or anxiety.  If she has a device that can record an actual ECG tracing (a smart watch or Kardia device), she can send me those tracings for confirmation.  I do not think it is necessary for her to wear a medical grade recording device.           Medication Adjustments/Labs and Tests Ordered: Current medicines are reviewed at length with the patient today.  Concerns regarding medicines are outlined above.  Orders Placed This Encounter  Procedures   EKG 12-Lead   No orders of the defined types were placed in this encounter.   Patient Instructions   Medication Instructions:  No changes *If you need a refill on your cardiac medications before your next appointment, please call your pharmacy*   Lab Work: None ordered If you have labs (blood work) drawn today and your tests are completely normal, you will receive your results only by: MyChart Message (if you have MyChart) OR A paper copy in the mail If you have any lab test that is abnormal or we need to change your treatment, we will call you to review the results.   Testing/Procedures: None ordered   Follow-Up: At [facility], you and your health needs are our priority.  As  part of our continuing mission to provide you with exceptional heart care, we have created designated Provider Care Teams.  These Care Teams include your primary Cardiologist (physician) and Advanced Practice Providers (APPs -  Physician Assistants and Nurse Practitioners) who all work together to provide you with the care you need, when you need it.  We recommend signing up for the patient portal called "MyChart".  Sign up information is provided on this After Visit Summary.  MyChart is used to connect with patients for Virtual Visits (Telemedicine).  Patients are able to view lab/test results, encounter notes, upcoming appointments, etc.  Non-urgent messages can be sent to your provider as well.   To learn more about what you can do with MyChart, go to [email or web address].    Your next appointment:   Follow up as needed with Dr. Royann Shivers    Signed, Thurmon Fair, MD  04/03/2021 2:31 PM    [facility]

## 2021-04-01 NOTE — Patient Instructions (Addendum)
Medication Instructions:  No changes *If you need a refill on your cardiac medications before your next appointment, please call your pharmacy*   Lab Work: None ordered If you have labs (blood work) drawn today and your tests are completely normal, you will receive your results only by: MyChart Message (if you have MyChart) OR A paper copy in the mail If you have any lab test that is abnormal or we need to change your treatment, we will call you to review the results.   Testing/Procedures: None ordered   Follow-Up: At [facility], you and your health needs are our priority.  As part of our continuing mission to provide you with exceptional heart care, we have created designated Provider Care Teams.  These Care Teams include your primary Cardiologist (physician) and Advanced Practice Providers (APPs -  Physician Assistants and Nurse Practitioners) who all work together to provide you with the care you need, when you need it.  We recommend signing up for the patient portal called "MyChart".  Sign up information is provided on this After Visit Summary.  MyChart is used to connect with patients for Virtual Visits (Telemedicine).  Patients are able to view lab/test results, encounter notes, upcoming appointments, etc.  Non-urgent messages can be sent to your provider as well.   To learn more about what you can do with MyChart, go to [email or web address].    Your next appointment:   Follow up as needed with Dr. Croitoru  

## 2021-04-03 ENCOUNTER — Encounter: Payer: Self-pay | Admitting: Cardiovascular Disease

## 2022-03-29 ENCOUNTER — Ambulatory Visit (INDEPENDENT_AMBULATORY_CARE_PROVIDER_SITE_OTHER): Payer: Medicaid Other

## 2022-03-29 ENCOUNTER — Encounter: Payer: Self-pay | Admitting: Orthopaedic Surgery

## 2022-03-29 ENCOUNTER — Ambulatory Visit (INDEPENDENT_AMBULATORY_CARE_PROVIDER_SITE_OTHER): Payer: Medicaid Other | Admitting: Orthopaedic Surgery

## 2022-03-29 DIAGNOSIS — M7061 Trochanteric bursitis, right hip: Secondary | ICD-10-CM

## 2022-03-29 DIAGNOSIS — M25551 Pain in right hip: Secondary | ICD-10-CM | POA: Insufficient documentation

## 2022-03-29 DIAGNOSIS — M79671 Pain in right foot: Secondary | ICD-10-CM

## 2022-03-29 MED ORDER — LIDOCAINE HCL 1 % IJ SOLN
3.0000 mL | INTRAMUSCULAR | Status: AC | PRN
  Administered 2022-03-29: 3 mL

## 2022-03-29 MED ORDER — BUPIVACAINE HCL 0.5 % IJ SOLN
3.0000 mL | INTRAMUSCULAR | Status: AC | PRN
  Administered 2022-03-29: 3 mL via INTRA_ARTICULAR

## 2022-03-29 MED ORDER — METHYLPREDNISOLONE ACETATE 40 MG/ML IJ SUSP
40.0000 mg | INTRAMUSCULAR | Status: AC | PRN
  Administered 2022-03-29: 40 mg via INTRA_ARTICULAR
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
## 2022-05-03 ENCOUNTER — Ambulatory Visit (HOSPITAL_BASED_OUTPATIENT_CLINIC_OR_DEPARTMENT_OTHER): Payer: Medicaid Other | Admitting: Family Medicine

## 2022-05-25 ENCOUNTER — Encounter (HOSPITAL_BASED_OUTPATIENT_CLINIC_OR_DEPARTMENT_OTHER): Payer: Self-pay

## 2022-06-10 ENCOUNTER — Ambulatory Visit (HOSPITAL_BASED_OUTPATIENT_CLINIC_OR_DEPARTMENT_OTHER): Payer: Medicaid Other | Admitting: Family Medicine

## 2022-07-19 ENCOUNTER — Ambulatory Visit (HOSPITAL_BASED_OUTPATIENT_CLINIC_OR_DEPARTMENT_OTHER): Payer: Medicaid Other | Admitting: Family Medicine

## 2023-03-23 ENCOUNTER — Encounter (HOSPITAL_BASED_OUTPATIENT_CLINIC_OR_DEPARTMENT_OTHER): Payer: Self-pay | Admitting: Family Medicine

## 2023-05-17 ENCOUNTER — Ambulatory Visit (INDEPENDENT_AMBULATORY_CARE_PROVIDER_SITE_OTHER): Payer: MEDICAID | Admitting: Certified Nurse Midwife

## 2023-05-17 ENCOUNTER — Other Ambulatory Visit (HOSPITAL_COMMUNITY)
Admission: RE | Admit: 2023-05-17 | Discharge: 2023-05-17 | Disposition: A | Discharge status: Home / Self Care | Payer: MEDICAID | Source: Ambulatory Visit | Attending: Certified Nurse Midwife | Admitting: Certified Nurse Midwife

## 2023-05-17 ENCOUNTER — Encounter (HOSPITAL_BASED_OUTPATIENT_CLINIC_OR_DEPARTMENT_OTHER): Payer: Self-pay | Admitting: Certified Nurse Midwife

## 2023-05-17 VITALS — BP 117/65 | HR 61 | Ht 63.0 in | Wt 144.6 lb

## 2023-05-17 DIAGNOSIS — N898 Other specified noninflammatory disorders of vagina: Secondary | ICD-10-CM | POA: Diagnosis present

## 2023-05-17 DIAGNOSIS — R87810 Cervical high risk human papillomavirus (HPV) DNA test positive: Secondary | ICD-10-CM | POA: Diagnosis not present

## 2023-05-17 DIAGNOSIS — Z23 Encounter for immunization: Secondary | ICD-10-CM

## 2023-05-17 DIAGNOSIS — Z30433 Encounter for removal and reinsertion of intrauterine contraceptive device: Secondary | ICD-10-CM | POA: Diagnosis not present

## 2023-05-17 DIAGNOSIS — Z7185 Encounter for immunization safety counseling: Secondary | ICD-10-CM

## 2023-05-17 DIAGNOSIS — R87610 Atypical squamous cells of undetermined significance on cytologic smear of cervix (ASC-US): Secondary | ICD-10-CM

## 2023-05-17 NOTE — Progress Notes (Signed)
 GYNECOLOGY  VISIT  CC:   Pt would like Mirena  IUD removed (expired) and new Mirena  IUD inserted. She experiences occasional vaginal spotting/bleeding. She has 3 children (2 sons and 1 daughter). Son is a Holiday representative at [employer]. Pt works full time at [employer].  HPI: 38 y.o. [id number] Single White or Caucasian female here for removal IUD and insertion new Mirena  IUD.   MEDS:   Current Outpatient Medications on File Prior to Visit  Medication Sig Dispense Refill   acetaminophen  (TYLENOL ) 325 MG tablet Take 325 mg by mouth every 6 (six) hours as needed for mild pain.     levonorgestrel  (MIRENA , 52 MG,) 20 MCG/DAY IUD Mirena  20 mcg/24 hours (8 yrs) 52 mg intrauterine device  Take 1 device by intrauterine route.     lurasidone (LATUDA) 40 MG TABS tablet Take 40 mg by mouth daily.     clonazePAM (KLONOPIN) 0.5 MG tablet Take 0.5 mg by mouth as needed. (Patient not taking: Reported on 05/17/2023)     traZODone (DESYREL) 50 MG tablet Take 50 mg by mouth as needed. (Patient not taking: Reported on 05/17/2023)     VYVANSE 50 MG capsule Take 50 mg by mouth every morning. (Patient not taking: Reported on 05/17/2023)     [DISCONTINUED] diphenhydrAMINE  (BENADRYL ) 25 MG tablet Take 25 mg by mouth at bedtime as needed for sleep.     No current facility-administered medications on file prior to visit.    ALLERGIES: Patient has no known allergies.  SH:  lives with her 3 children  Review of Systems  Constitutional: Negative.    PHYSICAL EXAMINATION:    BP 117/65 (BP Location: Left Arm, Patient Position: Sitting)   Pulse 61   Ht 5\' 3"  (1.6 m)   Wt 144 lb 9.6 oz (65.6 kg)   BMI 25.61 kg/m     General appearance: alert, cooperative and appears stated age   Pelvic: External genitalia:  no lesions              Urethra:  normal appearing urethra with no masses, tenderness or lesions              Bartholins and Skenes: normal                 Vagina: normal mucosa without prolapse or lesions and normal  without tenderness, induration or masses              Cervix: no cervical motion tenderness, no lesions, and IUD strings were not visible              Bimanual Exam:  Uterus:  normal size, contour, position, consistency, mobility, non-tender             Anus:  normal sphincter tone, no lesions  Chaperone, Myrtie Atkinson, CMA, was present for exam.  Assessment/Plan: 1. Vaginal discharge (Primary) - Cervicovaginal ancillary only( [place])  2. HPV vaccine counseling - HPV 9-valent vaccine,Recombinat  3. Need for HPV vaccination - HPV 9-valent vaccine,Recombinat  4. Encounter for removal and reinsertion of intrauterine contraceptive device - Pt desired removal of Mirena  and insertion of new Mirena  IUD for contraception.  5. 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 - HPV vaccination availability discussed and pt desired 1st Gardasil Vaccine today - She plans #2 at her next visit here in November 2025.      GYNECOLOGY OFFICE PROCEDURE NOTE  IUD Insertion Procedure Note Patient identified, informed consent performed, consent signed.   Discussed risks of irregular bleeding, cramping, infection, malpositioning or misplacement of the IUD outside the uterus which may require further procedure such as laparoscopy. Time out was performed.  Urine pregnancy test negative.  Speculum placed in the vagina.  Cervix visualized.  Cleaned with Betadine x 2.  Small forcep used to grasp IUD strings and remove IUD.  Uterus sounded to 6 cm.   IUD placed per manufacturer's recommendations.  Strings trimmed to 3 cm.  Patient tolerated procedure well.   No follow-ups on file.   Yolanda Hence, CNM 4:15 PM

## 2023-05-18 MED ORDER — LEVONORGESTREL 20 MCG/DAY IU IUD
1.0000 | INTRAUTERINE_SYSTEM | Freq: Once | INTRAUTERINE | Status: AC
  Administered 2023-05-17: 1 via INTRAUTERINE

## 2023-05-18 NOTE — Addendum Note (Signed)
Addended by: Hendricks Milo on: 05/18/2023 11:53 AM   Modules accepted: Orders

## 2023-05-19 ENCOUNTER — Encounter (HOSPITAL_BASED_OUTPATIENT_CLINIC_OR_DEPARTMENT_OTHER): Payer: Self-pay | Admitting: Certified Nurse Midwife

## 2023-05-19 ENCOUNTER — Other Ambulatory Visit (HOSPITAL_BASED_OUTPATIENT_CLINIC_OR_DEPARTMENT_OTHER): Payer: Self-pay | Admitting: Certified Nurse Midwife

## 2023-05-19 LAB — CERVICOVAGINAL ANCILLARY ONLY
Bacterial Vaginitis (gardnerella): POSITIVE — AB
Candida Glabrata: NEGATIVE
Candida Vaginitis: NEGATIVE
Chlamydia: NEGATIVE
Comment: NEGATIVE
Comment: NEGATIVE
Comment: NEGATIVE
Comment: NEGATIVE
Comment: NEGATIVE
Comment: NORMAL
Neisseria Gonorrhea: NEGATIVE
Trichomonas: NEGATIVE

## 2023-05-19 MED ORDER — METRONIDAZOLE 500 MG PO TABS: 500.0000 mg | ORAL_TABLET | Freq: Two times a day (BID) | ORAL | 3 refills | Status: AC

## 2024-01-11 ENCOUNTER — Encounter (HOSPITAL_BASED_OUTPATIENT_CLINIC_OR_DEPARTMENT_OTHER): Payer: Self-pay

## 2024-03-11 ENCOUNTER — Ambulatory Visit (HOSPITAL_BASED_OUTPATIENT_CLINIC_OR_DEPARTMENT_OTHER): Payer: MEDICAID | Admitting: Certified Nurse Midwife

## 2024-03-15 ENCOUNTER — Encounter (HOSPITAL_BASED_OUTPATIENT_CLINIC_OR_DEPARTMENT_OTHER): Payer: Self-pay | Admitting: Certified Nurse Midwife

## 2024-03-15 ENCOUNTER — Ambulatory Visit (INDEPENDENT_AMBULATORY_CARE_PROVIDER_SITE_OTHER): Payer: MEDICAID | Admitting: Certified Nurse Midwife

## 2024-03-15 ENCOUNTER — Other Ambulatory Visit (HOSPITAL_COMMUNITY)
Admission: RE | Admit: 2024-03-15 | Discharge: 2024-03-15 | Disposition: A | Discharge status: Home / Self Care | Payer: MEDICAID | Source: Ambulatory Visit | Attending: Certified Nurse Midwife | Admitting: Certified Nurse Midwife

## 2024-03-15 VITALS — BP 125/80 | HR 74 | Ht 63.0 in | Wt 143.8 lb

## 2024-03-15 DIAGNOSIS — R87618 Other abnormal cytological findings on specimens from cervix uteri: Secondary | ICD-10-CM

## 2024-03-15 DIAGNOSIS — R4586 Emotional lability: Secondary | ICD-10-CM

## 2024-03-15 DIAGNOSIS — Z23 Encounter for immunization: Secondary | ICD-10-CM | POA: Diagnosis not present

## 2024-03-15 DIAGNOSIS — Z1283 Encounter for screening for malignant neoplasm of skin: Secondary | ICD-10-CM

## 2024-03-15 DIAGNOSIS — Z124 Encounter for screening for malignant neoplasm of cervix: Secondary | ICD-10-CM | POA: Diagnosis present

## 2024-03-15 DIAGNOSIS — Z01419 Encounter for gynecological examination (general) (routine) without abnormal findings: Secondary | ICD-10-CM | POA: Diagnosis not present

## 2024-03-15 DIAGNOSIS — Z1331 Encounter for screening for depression: Secondary | ICD-10-CM | POA: Diagnosis not present

## 2024-03-15 DIAGNOSIS — Z1231 Encounter for screening mammogram for malignant neoplasm of breast: Secondary | ICD-10-CM

## 2024-03-15 MED ORDER — HPV 9-VALENT RECOMB VACCINE IM SUSP: 0.5000 mL | Freq: Once | INTRAMUSCULAR | 0 refills | Status: AC

## 2024-03-15 MED ORDER — VITAMIN D (ERGOCALCIFEROL) 1.25 MG (50000 UNIT) PO CAPS: 50000.0000 [IU] | ORAL_CAPSULE | ORAL | 12 refills | Status: AC

## 2024-03-15 NOTE — Progress Notes (Signed)
 38 y.o. [id number] Single White or Caucasian female here for annual exam and repeat pap smear (abnormal pap 03/2023). She is on Vivitrol (to prevent alcohol consumption)  Patient's last menstrual period was 02/22/2024.          Sexually active: No.  The current method of family planning is IUD.    Exercising: No  none Smoker:  yes  Health Maintenance: Pap:  12/13/2016 History of abnormal Pap:  yes MMG:  Ordered for April 2026 (Age 2) Colonoscopy:  Plan at age 69 BMD:   n/a Screening Labs: Needs TSH   reports that she has been smoking cigarettes. She has a 10 pack-year smoking history. She has never used smokeless tobacco. She reports current alcohol use. She reports that she does not use drugs. Smoking cessation encouraged.    Past Surgical History:  Procedure Laterality Date   WISDOM TOOTH EXTRACTION      Current Outpatient Medications  Medication Sig Dispense Refill   hydrOXYzine (ATARAX) 10 MG tablet Take 10 mg by mouth 3 (three) times daily as needed for anxiety.     hydrOXYzine (ATARAX) 25 MG tablet Take 25 mg by mouth 3 (three) times daily as needed (sleep).     levonorgestrel  (MIRENA , 52 MG,) 20 MCG/DAY IUD Mirena  20 mcg/24 hours (8 yrs) 52 mg intrauterine device  Take 1 device by intrauterine route.     metroNIDAZOLE  (FLAGYL ) 500 MG tablet Take 1 tablet (500 mg total) by mouth 2 (two) times daily. 14 tablet 3   Vitamin D, Ergocalciferol, (DRISDOL) 1.25 MG (50000 UNIT) CAPS capsule Take 1 capsule (50,000 Units total) by mouth every 7 (seven) days. 5 capsule 12   VIVITROL 380 MG SUSR IM injection Inject 380 mg into the muscle every 30 (thirty) days.     acetaminophen  (TYLENOL ) 325 MG tablet Take 325 mg by mouth every 6 (six) hours as needed for mild pain.     clonazePAM (KLONOPIN) 0.5 MG tablet Take 0.5 mg by mouth as needed. (Patient not taking: Reported on 05/17/2023)     lamoTRIgine (LAMICTAL) 100 MG tablet Take 100 mg by mouth daily.     lurasidone (LATUDA) 40 MG TABS  tablet Take 40 mg by mouth daily.     traZODone (DESYREL) 50 MG tablet Take 50 mg by mouth as needed. (Patient not taking: Reported on 05/17/2023)     VYVANSE 50 MG capsule Take 50 mg by mouth every morning. (Patient not taking: Reported on 05/17/2023)     No current facility-administered medications for this visit.    Family History  Problem Relation Age of Onset   Hypertension Mother    Hypertension Father    Diabetes Maternal Aunt    Cancer Paternal Uncle    Breast cancer Maternal Grandmother    Cancer Maternal Grandmother        skin    ROS: Constitutional: mood changes Genitourinary:negative  Exam:   BP 125/80   Pulse 74   Ht 5' 3 (1.6 m)   Wt 143 lb 12.8 oz (65.2 kg)   LMP 02/22/2024   BMI 25.47 kg/m   Height: 5' 3 (160 cm)  General appearance: alert, cooperative and appears stated age Head: Normocephalic, without obvious abnormality, atraumatic Lungs: clear to auscultation bilaterally Breasts: normal appearance, no masses or tenderness, Inspection negative, No nipple retraction or dimpling, No nipple discharge or bleeding, No axillary or supraclavicular adenopathy, Normal to palpation without dominant masses Heart: regular rate and rhythm Abdomen: soft, non-tender; bowel sounds normal; no masses,  no organomegaly Extremities: extremities normal, atraumatic, no cyanosis or edema Skin: Skin color, texture, turgor normal. No rashes or lesions Lymph nodes: Cervical, supraclavicular, and axillary nodes normal. No abnormal inguinal nodes palpated Neurologic: Grossly normal   Pelvic: External genitalia:  no lesions              Urethra:  normal appearing urethra with no masses, tenderness or lesions              Bartholins and Skenes: normal                 Vagina: normal appearing vagina with normal color and no discharge, no lesions              Cervix: multiparous appearance, no bleeding following Pap, no cervical motion tenderness, and no lesions IUD strings  visible and palpable              Pap taken: Yes.   Bimanual Exam:  Uterus:  normal size, contour, position, consistency, mobility, non-tender              Adnexa: no mass, fullness, tenderness               Rectovaginal: Confirms               Anus:  normal sphincter tone, no lesions  Chaperone,  CMA, was present for exam.  Assessment/Plan:  1. Cervical cancer screening - Last pap 03/2023 ASCUS with HPV+ - Cytology - PAP( [place])  2. Need for HPV vaccination (Primary) - HPV #2 planned for today, repeat in 6 months - HPV 9-valent vaccine,Recombinat  3. Mood changes - Pt working on alcohol and tobacco cessation - TSH - [facility]  4. Other abnormal cytological finding of specimen from cervix - Cytology - PAP( [place])  5. Encounter for screening mammogram for malignant neoplasm of breast - MM 3D SCREENING MAMMOGRAM BILATERAL BREAST; Future  6. Screening exam for skin cancer - Ambulatory referral to Dermatology   RTO in 6 months for Gardasil/HPV Vaccine #3. RTO in 1 year for annual gyn exam and prn if issues arise. Arland MARLA Roller

## 2024-03-16 LAB — TSH: TSH: 0.718 u[IU]/mL (ref 0.450–4.500)

## 2024-03-16 LAB — FOLLICLE STIMULATING HORMONE: FSH: 7.8 m[IU]/mL

## 2024-03-18 LAB — CYTOLOGY - PAP
Chlamydia: NEGATIVE
Comment: NEGATIVE
Comment: NEGATIVE
Comment: NEGATIVE
Comment: NORMAL
Diagnosis: NEGATIVE
High risk HPV: NEGATIVE
Neisseria Gonorrhea: NEGATIVE
Trichomonas: NEGATIVE

## 2024-03-20 ENCOUNTER — Ambulatory Visit (HOSPITAL_BASED_OUTPATIENT_CLINIC_OR_DEPARTMENT_OTHER): Payer: Self-pay | Admitting: Certified Nurse Midwife

## 2024-08-05 ENCOUNTER — Ambulatory Visit: Payer: MEDICAID

## 2024-09-12 ENCOUNTER — Ambulatory Visit (HOSPITAL_BASED_OUTPATIENT_CLINIC_OR_DEPARTMENT_OTHER): Payer: MEDICAID

## 2024-10-28 ENCOUNTER — Ambulatory Visit: Payer: MEDICAID | Admitting: Dermatology
